# Patient Record
Sex: Male | Born: 1985
Health system: Southern US, Community
[De-identification: ages and names within clinical notes are randomized; demographics above are authoritative.]

## PROBLEM LIST (undated history)

## (undated) DIAGNOSIS — F909 Attention-deficit hyperactivity disorder, unspecified type: Secondary | ICD-10-CM

## (undated) HISTORY — DX: Attention-deficit hyperactivity disorder, unspecified type: F90.9

---

## 2010-08-16 DIAGNOSIS — F419 Anxiety disorder, unspecified: Secondary | ICD-10-CM | POA: Insufficient documentation

## 2012-04-02 ENCOUNTER — Encounter: Payer: Self-pay | Admitting: Family Medicine

## 2012-04-02 ENCOUNTER — Ambulatory Visit (INDEPENDENT_AMBULATORY_CARE_PROVIDER_SITE_OTHER): Payer: No Typology Code available for payment source | Admitting: Family Medicine

## 2012-04-02 VITALS — BP 126/74 | HR 86 | Temp 98.6°F | Resp 16 | Ht 69.0 in | Wt 184.0 lb

## 2012-04-02 DIAGNOSIS — F909 Attention-deficit hyperactivity disorder, unspecified type: Secondary | ICD-10-CM

## 2012-04-02 MED ORDER — LISDEXAMFETAMINE DIMESYLATE 30 MG PO CAPS: 30.0000 mg | ORAL_CAPSULE | ORAL | Status: DC

## 2012-04-02 NOTE — Patient Instructions (Signed)
Attention Deficit Hyperactivity Disorder Attention deficit hyperactivity disorder (ADHD) is a problem with behavior issues based on the way the brain functions (neurobehavioral disorder). It is a common reason for behavior and academic problems in school. CAUSES  The cause of ADHD is unknown in most cases. It may run in families. It sometimes can be associated with learning disabilities and other behavioral problems. SYMPTOMS  There are 3 types of ADHD. The 3 types and some of the symptoms include:  Inattentive  Gets bored or distracted easily.  Loses or forgets things. Forgets to hand in homework.  Has trouble organizing or completing tasks.  Difficulty staying on task.  An inability to organize daily tasks and school work.  Leaving projects, chores, or homework unfinished.  Trouble paying attention or responding to details. Careless mistakes.  Difficulty following directions. Often seems like is not listening.  Dislikes activities that require sustained attention (like chores or homework).  Hyperactive-impulsive  Feels like it is impossible to sit still or stay in a seat. Fidgeting with hands and feet.  Trouble waiting turn.  Talking too much or out of turn. Interruptive.  Speaks or acts impulsively.  Aggressive, disruptive behavior.  Constantly busy or on the go, noisy.  Combined  Has symptoms of both of the above. Often children with ADHD feel discouraged about themselves and with school. They often perform well below their abilities in school. These symptoms can cause problems in home, school, and in relationships with peers. As children get older, the excess motor activities can calm down, but the problems with paying attention and staying organized persist. Most children do not outgrow ADHD but with good treatment can learn to cope with the symptoms. DIAGNOSIS  When ADHD is suspected, the diagnosis should be made by professionals trained in ADHD.  Diagnosis will  include:  Ruling out other reasons for the child's behavior.  The caregivers will check with the child's school and check their medical records.  They will talk to teachers and parents.  Behavior rating scales for the child will be filled out by those dealing with the child on a daily basis. A diagnosis is made only after all information has been considered. TREATMENT  Treatment usually includes behavioral treatment often along with medicines. It may include stimulant medicines. The stimulant medicines decrease impulsivity and hyperactivity and increase attention. Other medicines used include antidepressants and certain blood pressure medicines. Most experts agree that treatment for ADHD should address all aspects of the child's functioning. Treatment should not be limited to the use of medicines alone. Treatment should include structured classroom management. The parents must receive education to address rewarding good behavior, discipline, and limit-setting. Tutoring or behavioral therapy or both should be available for the child. If untreated, the disorder can have long-term serious effects into adolescence and adulthood. HOME CARE INSTRUCTIONS   Often with ADHD there is a lot of frustration among the family in dealing with the illness. There is often blame and anger that is not warranted. This is a life long illness. There is no way to prevent ADHD. In many cases, because the problem affects the family as a whole, the entire family may need help. A therapist can help the family find better ways to handle the disruptive behaviors and promote change. If the child is young, most of the therapist's work is with the parents. Parents will learn techniques for coping with and improving their child's behavior. Sometimes only the child with the ADHD needs counseling. Your caregivers can help   you make these decisions.  Children with ADHD may need help in organizing. Some helpful tips include:  Keep  routines the same every day from wake-up time to bedtime. Schedule everything. This includes homework and playtime. This should include outdoor and indoor recreation. Keep the schedule on the refrigerator or a bulletin board where it is frequently seen. Mark schedule changes as far in advance as possible.  Have a place for everything and keep everything in its place. This includes clothing, backpacks, and school supplies.  Encourage writing down assignments and bringing home needed books.  Offer your child a well-balanced diet. Breakfast is especially important for school performance. Children should avoid drinks with caffeine including:  Soft drinks.  Coffee.  Tea.  However, some older children (adolescents) may find these drinks helpful in improving their attention.  Children with ADHD need consistent rules that they can understand and follow. If rules are followed, give small rewards. Children with ADHD often receive, and expect, criticism. Look for good behavior and praise it. Set realistic goals. Give clear instructions. Look for activities that can foster success and self-esteem. Make time for pleasant activities with your child. Give lots of affection.  Parents are their children's greatest advocates. Learn as much as possible about ADHD. This helps you become a stronger and better advocate for your child. It also helps you educate your child's teachers and instructors if they feel inadequate in these areas. Parent support groups are often helpful. A national group with local chapters is called CHADD (Children and Adults with Attention Deficit Hyperactivity Disorder). PROGNOSIS  There is no cure for ADHD. Children with the disorder seldom outgrow it. Many find adaptive ways to accommodate the ADHD as they mature. SEEK MEDICAL CARE IF:  Your child has repeated muscle twitches, cough or speech outbursts.  Your child has sleep problems.  Your child has a marked loss of  appetite.  Your child develops depression.  Your child has new or worsening behavioral problems.  Your child develops dizziness.  Your child has a racing heart.  Your child has stomach pains.  Your child develops headaches. Document Released: 01/17/2002 Document Revised: 04/21/2011 Document Reviewed: 08/30/2007 Exit[employer] Patient Information 2013 [employer].  

## 2012-04-02 NOTE — Progress Notes (Signed)
Subjective:    Patient ID: Nathaniel Soto, male    DOB: 1985-04-26, 27 y.o.   MRN: [id number] Chief Complaint  Patient presents with  . Advice Only    ADD issues    HPI  Started on adderrall from Jan to July 2012 during his senior yr of college. Diagnosed by Dr. Tish Frederickson - recommended by [facility] mental health dept. - it was in [place] - he things she might have been in her own practice.  One of the leading ADD people in that area.  Was then started on 20mg  of ER adderrall which helped immensely.  After he graduated, he continued to have a prescription for it, which he brought in today, but he couldn't get it filled because he was out of insurance and it was very expensive.  Has never been on anything else other than adderrall but was interested in trying something longer acting.  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  No past medical history on file. No current outpatient prescriptions on file prior to visit.   No current facility-administered medications on file prior to visit.   No Known Allergies   Review of Systems  Constitutional: Negative for fever, chills, diaphoresis, activity change, appetite change, fatigue and unexpected weight change.  Respiratory: Negative for chest tightness and shortness of breath.   Cardiovascular: Negative for chest pain and palpitations.  Gastrointestinal: Negative for nausea and vomiting.  Neurological: Negative for dizziness, tremors, syncope and light-headedness.  Psychiatric/Behavioral: Positive for decreased concentration. Negative for suicidal ideas, hallucinations, confusion, sleep disturbance, self-injury, dysphoric mood and agitation. The patient is nervous/anxious. The patient is not hyperactive.       BP 126/74  Pulse 86  Temp(Src) 98.6 F (37 C)  Resp 16  Ht 5\' 9"  (1.753 m)  Wt 184 lb (83.462 kg)  BMI 27.16 kg/m2 Objective:   Physical Exam  Constitutional: He is oriented to person, place,  and time. He appears well-developed and well-nourished. No distress.  HENT:  Head: Normocephalic and atraumatic.  Eyes: Conjunctivae are normal. Pupils are equal, round, and reactive to light. No scleral icterus.  Neck: Normal range of motion. Neck supple. No thyromegaly present.  Cardiovascular: Normal rate, regular rhythm, normal heart sounds and intact distal pulses.   Pulmonary/Chest: Effort normal and breath sounds normal. No respiratory distress.  Musculoskeletal: He exhibits no edema.  Lymphadenopathy:    He has no cervical adenopathy.  Neurological: He is alert and oriented to person, place, and time.  Skin: Skin is warm and dry. He is not diaphoretic.  Psychiatric: He has a normal mood and affect. His behavior is normal.      Assessment & Plan:  Attention deficit disorder with hyperactivity - Plan: lisdexamfetamine (VYVANSE) 30 MG capsule, DISCONTINUED: lisdexamfetamine (VYVANSE) 30 MG capsule, DISCONTINUED: lisdexamfetamine (VYVANSE) 30 MG capsule  Meds ordered this encounter  Medications  . DISCONTD: lisdexamfetamine (VYVANSE) 30 MG capsule    Sig: Take 1 capsule (30 mg total) by mouth every morning.    Dispense:  30 capsule    Refill:  0  . DISCONTD: lisdexamfetamine (VYVANSE) 30 MG capsule    Sig: Take 1 capsule (30 mg total) by mouth every morning. May fill on or after 04/30/12.    Dispense:  30 capsule    Refill:  0  . lisdexamfetamine (VYVANSE) 30 MG capsule    Sig: Take 1 capsule (30 mg total) by mouth every morning. May fill on or  after 05/31/12.    Dispense:  30 capsule    Refill:  0

## 2012-07-12 ENCOUNTER — Ambulatory Visit (INDEPENDENT_AMBULATORY_CARE_PROVIDER_SITE_OTHER): Payer: 59 | Admitting: Family Medicine

## 2012-07-12 VITALS — BP 110/66 | HR 67 | Temp 98.3°F | Resp 16 | Ht 68.0 in | Wt 183.8 lb

## 2012-07-12 DIAGNOSIS — F909 Attention-deficit hyperactivity disorder, unspecified type: Secondary | ICD-10-CM

## 2012-07-12 MED ORDER — LISDEXAMFETAMINE DIMESYLATE 30 MG PO CAPS: 30.0000 mg | ORAL_CAPSULE | ORAL | Status: DC

## 2012-07-12 NOTE — Progress Notes (Signed)
Subjective:    Patient ID: Nathaniel Soto, male    DOB: [birth year], 27 y.o.   MRN: [id number] Chief Complaint  Patient presents with  . Medication Refill    HPI  vyvanse has helped - better dry mouth, less side effects than adderrall.  Not completely perfect - still sometimes a little dry mouth but not near as much as the adderrall - generally mouth wash or gum with xyletol helps a lot.Only tends to bother him 1-2x/wk while it was several times daily on the adderrall.  If he forgets to take the vyvance, he can't take it after noon so sometimes misses dose and has unfocused days. Otherwise doing great. 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 Has also been on strattera and Wellbutrin in the distant past but didn't help as much and hated the way it made him feels.  Has never tried ritalin or concerta. Sleeping well. Eating well.  History reviewed. No pertinent past medical history. No current outpatient prescriptions on file prior to visit.   No current facility-administered medications on file prior to visit.   No Known Allergies  Review of Systems  Constitutional: Negative for diaphoresis, activity change, appetite change and unexpected weight change.  HENT: Positive for trouble swallowing. Negative for drooling.        Dry mouth  Respiratory: Negative for chest tightness and shortness of breath.   Cardiovascular: Negative for chest pain and palpitations.  Gastrointestinal: Negative for nausea and vomiting.  Neurological: Negative for dizziness, tremors, syncope and light-headedness.  Psychiatric/Behavioral: Positive for decreased concentration. Negative for suicidal ideas, hallucinations, confusion, sleep disturbance, self-injury, dysphoric mood and agitation. The patient is not nervous/anxious and is not hyperactive.       BP 110/66  Pulse 67  Temp(Src) 98.3 F (36.8 C) (Oral)  Resp 16  Ht 5\' 8"  (1.727 m)  Wt 183 lb 12.8 oz (83.371 kg)  BMI 27.95 kg/m2   SpO2 99% Objective:   Physical Exam  Constitutional: He is oriented to person, place, and time. He appears well-developed and well-nourished. No distress.  HENT:  Head: Normocephalic and atraumatic.  Eyes: Conjunctivae are normal. Pupils are equal, round, and reactive to light. No scleral icterus.  Neck: Normal range of motion. Neck supple. No thyromegaly present.  Cardiovascular: Normal rate, regular rhythm, normal heart sounds and intact distal pulses.   Pulmonary/Chest: Effort normal and breath sounds normal. No respiratory distress.  Musculoskeletal: He exhibits no edema.  Lymphadenopathy:    He has no cervical adenopathy.  Neurological: He is alert and oriented to person, place, and time.  Skin: Skin is warm and dry. He is not diaphoretic.  Psychiatric: He has a normal mood and affect. His behavior is normal.      Assessment & Plan:  ADHD - side effects manageable so wants to hold off on trying ritalin or concerta for now but can in the future if he develops more sxs vyvanse.  Consider doing routine labs at f/u but declines today - needle phobia. 3 rxs given.  No further refills till next visit around 10/12/12 Meds ordered this encounter  Medications  . DISCONTD: lisdexamfetamine (VYVANSE) 30 MG capsule    Sig: Take 1 capsule (30 mg total) by mouth every morning.    Dispense:  30 capsule    Refill:  0  . DISCONTD: lisdexamfetamine (VYVANSE) 30 MG capsule    Sig: Take 1 capsule (30 mg total) by mouth every morning. May fill on or after 08/11/12  Dispense:  30 capsule    Refill:  0  . lisdexamfetamine (VYVANSE) 30 MG capsule    Sig: Take 1 capsule (30 mg total) by mouth every morning. May fill on or after 09/11/12    Dispense:  30 capsule    Refill:  0

## 2012-11-12 ENCOUNTER — Ambulatory Visit (INDEPENDENT_AMBULATORY_CARE_PROVIDER_SITE_OTHER): Payer: 59 | Admitting: Physician Assistant

## 2012-11-12 VITALS — BP 112/66 | HR 66 | Temp 98.3°F | Resp 18 | Ht 69.5 in | Wt 201.0 lb

## 2012-11-12 DIAGNOSIS — R0981 Nasal congestion: Secondary | ICD-10-CM

## 2012-11-12 DIAGNOSIS — B354 Tinea corporis: Secondary | ICD-10-CM

## 2012-11-12 DIAGNOSIS — R05 Cough: Secondary | ICD-10-CM

## 2012-11-12 DIAGNOSIS — J329 Chronic sinusitis, unspecified: Secondary | ICD-10-CM

## 2012-11-12 DIAGNOSIS — J3489 Other specified disorders of nose and nasal sinuses: Secondary | ICD-10-CM

## 2012-11-12 DIAGNOSIS — R059 Cough, unspecified: Secondary | ICD-10-CM

## 2012-11-12 MED ORDER — KETOCONAZOLE 2 % EX CREA: TOPICAL_CREAM | Freq: Every day | CUTANEOUS | Status: DC

## 2012-11-12 MED ORDER — AMOXICILLIN 875 MG PO TABS: 875.0000 mg | ORAL_TABLET | Freq: Two times a day (BID) | ORAL | Status: DC

## 2012-11-12 MED ORDER — BENZONATATE 100 MG PO CAPS: 100.0000 mg | ORAL_CAPSULE | Freq: Three times a day (TID) | ORAL | Status: DC | PRN

## 2012-11-12 MED ORDER — FLUCONAZOLE 150 MG PO TABS: 150.0000 mg | ORAL_TABLET | Freq: Once | ORAL | Status: DC

## 2012-11-12 MED ORDER — IPRATROPIUM BROMIDE 0.03 % NA SOLN: 2.0000 | Freq: Two times a day (BID) | NASAL | Status: DC

## 2012-11-12 NOTE — Progress Notes (Signed)
  Subjective:    Patient ID: Nathaniel Soto, male    DOB: 25-Oct-1985, 27 y.o.   MRN: [id number]  Rash Associated symptoms include congestion, coughing, rhinorrhea and a sore throat. Pertinent negatives include no fever, shortness of breath or vomiting.   27 year old pleasant male presents today with 2 concerns: #1) URI sx's x 1 week. Complains of nasal congestion, PND, dry, hacking cough, and chills. No documented fever, headache, sinus pain, SOB, wheezing, chest pain, or otalgia.  Has not taken any OTC medications for this. No hx of allergies.   #2) Rash bilateral forearms x 4 weeks.  Got new kitten in July that developed ringworm about 1 months after he got her.  Has been using anti-fungal spray for her which has helped significantly.  Has used OTC Lotrimin cream on his arms that does not seem to be helping at all. Has had several new spots erupt and so he decided to come in for evaluation.  Describes the area as pruritic and spreading.  All spots have enlarged since onset.  Admits he looked at the lesions under a black light and there were green speckles in them.   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   Review of Systems  Constitutional: Positive for chills. Negative for fever.  HENT: Positive for congestion, sore throat, rhinorrhea, postnasal drip and sinus pressure. Negative for ear pain and ear discharge.   Respiratory: Positive for cough. Negative for chest tightness, shortness of breath and wheezing.   Gastrointestinal: Negative for nausea and vomiting.  Skin: Positive for rash.  Neurological: Negative for dizziness and headaches.       Objective:   Physical Exam  Constitutional: He is oriented to person, place, and time. He appears well-developed and well-nourished.  HENT:  Head: Normocephalic and atraumatic.  Right Ear: Hearing, tympanic membrane, external ear and ear canal normal.  Left Ear: Hearing, tympanic membrane, external ear and ear canal normal.  Mouth/Throat: Uvula is  midline, oropharynx is clear and moist and mucous membranes are normal. No oropharyngeal exudate.  Eyes: Conjunctivae are normal.  Neck: Normal range of motion. Neck supple.  Cardiovascular: Normal rate, regular rhythm and normal heart sounds.   Pulmonary/Chest: Effort normal and breath sounds normal.  Lymphadenopathy:    He has no cervical adenopathy.  Neurological: He is alert and oriented to person, place, and time.  Skin:     Noted areas have round, erythematous, annular lesions with raised, scaling borders.    Psychiatric: He has a normal mood and affect. His behavior is normal. Judgment and thought content normal.          Assessment & Plan:  Tinea corporis - Plan: fluconazole (DIFLUCAN) 150 MG tablet, ketoconazole (NIZORAL) 2 % cream  - Ketoconazole 2% cream daily   - Diflucan 150 mg x 2 doses Nasal congestion - Plan: ipratropium (ATROVENT) 0.03 % nasal spray Sinusitis - Plan: amoxicillin (AMOXIL) 875 MG tablet Cough - Plan: benzonatate (TESSALON) 100 MG capsule  - Atrovent NS twice daily to help with congestion and PND  - Tessalon perles tid prn cough  - Increase fluids and rest  - Amoxicillin 875 mg bid x 10 days Follow up if symptoms worsen or fail to improve.

## 2012-12-22 ENCOUNTER — Ambulatory Visit (INDEPENDENT_AMBULATORY_CARE_PROVIDER_SITE_OTHER): Payer: 59 | Admitting: Family Medicine

## 2012-12-22 VITALS — BP 108/58 | HR 64 | Temp 98.5°F | Resp 16 | Ht 68.5 in | Wt 203.4 lb

## 2012-12-22 DIAGNOSIS — F909 Attention-deficit hyperactivity disorder, unspecified type: Secondary | ICD-10-CM

## 2012-12-22 MED ORDER — LISDEXAMFETAMINE DIMESYLATE 20 MG PO CAPS: 20.0000 mg | ORAL_CAPSULE | ORAL | Status: DC

## 2012-12-22 NOTE — Progress Notes (Signed)
Subjective:    Patient ID: Nathaniel Soto, male    DOB: 1985/04/22, 27 y.o.   MRN: [id number] This chart was scribed for Norberto Sorenson, MD by Clydene Laming, ED Scribe. This patient was seen in room Room 14 and the patient's care was started at 8:11 PM. HPI Chief Complaint  Patient presents with  . Medication Refill    Vyvanse refill, pt request to have the dosage lowered     HPI Comments: Nathaniel Soto is a 27 y.o. male who presents to [facility] requesting a medication refill. He wants to go down on Vyvanse dosage. Has been on 30mg  of Vyvanse for over 6 mos. He states if he forgets to take in the morning, he absolutely cannot take after noon as then he would not be able to sleep at all that night. He states that he has noticed that if he does not sleep well and then takes vyvanse the next day, by the end of the day, his "mood goes to complete crap". He states the upswing is neurotic and the downswing is very negative. He states the dry mouth side effect is fine and minimal. 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  In the past he has tried meds to help sleep. He states melatonin "crushes" him in the morning. He does want to take xanax due to his mothers use and side effects. He has been using low dose trazodone prn - still has a few left from prior PCP. His mother gave him benadryl to sleep when he was young so doesn't really like to take this anymore.  Pt was last seen 5 mts ago, given 3 refills at that time, so clearly not using stimulant therapy too frequently - about 1/2 the time. Failed adderall and stratterra and wellbutrin.  Past Medical History  Diagnosis Date  . ADHD (attention deficit hyperactivity disorder)   .  No Known Allergies Current Outpatient Prescriptions on File Prior to Visit  Medication Sig Dispense Refill  . amoxicillin (AMOXIL) 875 MG tablet Take 1 tablet (875 mg total) by mouth 2 (two) times daily.  20 tablet  0  . benzonatate (TESSALON) 100 MG  capsule Take 1-2 capsules (100-200 mg total) by mouth 3 (three) times daily as needed for cough.  40 capsule  0  . fluconazole (DIFLUCAN) 150 MG tablet Take 1 tablet (150 mg total) by mouth once. Repeat if needed  2 tablet  0  . ipratropium (ATROVENT) 0.03 % nasal spray Place 2 sprays into the nose 2 (two) times daily.  30 mL  5  . ketoconazole (NIZORAL) 2 % cream Apply topically daily.  15 g  0   No current facility-administered medications on file prior to visit.    Review of Systems  Constitutional: Negative for diaphoresis, activity change, appetite change, fatigue and unexpected weight change.  Respiratory: Negative for chest tightness and shortness of breath.   Cardiovascular: Negative for chest pain and palpitations.  Gastrointestinal: Negative for nausea and vomiting.  Neurological: Negative for dizziness, tremors, syncope and light-headedness.  Psychiatric/Behavioral: Positive for sleep disturbance and decreased concentration. Negative for suicidal ideas, hallucinations, behavioral problems, confusion, self-injury, dysphoric mood and agitation. The patient is not nervous/anxious and is not hyperactive.       BP 108/58  Pulse 64  Temp(Src) 98.5 F (36.9 C) (Oral)  Resp 16  Ht 5' 8.5" (1.74 m)  Wt 203 lb 6.4 oz (92.262 kg)  BMI 30.47 kg/m2  SpO2 99% Objective:  Physical Exam  Nursing note and vitals reviewed. Constitutional: He is oriented to person, place, and time. He appears well-developed and well-nourished. No distress.  HENT:  Head: Normocephalic and atraumatic.  Eyes: Conjunctivae and EOM are normal. Pupils are equal, round, and reactive to light. No scleral icterus.  Neck: Normal range of motion. Neck supple. No tracheal deviation present. No thyromegaly present.  Cardiovascular: Normal rate, regular rhythm, normal heart sounds and intact distal pulses.   Pulmonary/Chest: Effort normal and breath sounds normal. No respiratory distress.  Musculoskeletal: Normal  range of motion. He exhibits no edema.  Lymphadenopathy:    He has no cervical adenopathy.  Neurological: He is alert and oriented to person, place, and time.  Skin: Skin is warm and dry. He is not diaphoretic.  Psychiatric: He has a normal mood and affect. His behavior is normal.      Assessment & Plan:   Attention deficit disorder with hyperactivity(314.01) - Plan: lisdexamfetamine (VYVANSE) 20 MG capsule Try to decrease vyvanse from 30 to 20mg  qd. Pt will let me know how he does on this and then I will give him 2 additional rxs. Discussed ok to cont augmenting w/ prn trazodone 25mg  for sleep. If he is not having enough focus, could consider augmenting w/ very low dose of ritalin such as 5mg  short-acting. Meds ordered this encounter  Medications  . lisdexamfetamine (VYVANSE) 20 MG capsule    Sig: Take 1 capsule (20 mg total) by mouth every morning. May fill on or after 09/11/12    Dispense:  30 capsule    Refill:  0    I personally performed the services described in this documentation, which was scribed in my presence. The recorded information has been reviewed and considered, and addended by me as needed.  Norberto Sorenson, MD MPH

## 2013-01-31 ENCOUNTER — Ambulatory Visit (INDEPENDENT_AMBULATORY_CARE_PROVIDER_SITE_OTHER): Payer: 59 | Admitting: Family Medicine

## 2013-01-31 DIAGNOSIS — F909 Attention-deficit hyperactivity disorder, unspecified type: Secondary | ICD-10-CM

## 2013-01-31 MED ORDER — LISDEXAMFETAMINE DIMESYLATE 20 MG PO CAPS: 20.0000 mg | ORAL_CAPSULE | Freq: Every day | ORAL | Status: DC

## 2013-01-31 MED ORDER — LISDEXAMFETAMINE DIMESYLATE 20 MG PO CAPS: 20.0000 mg | ORAL_CAPSULE | ORAL | Status: DC

## 2013-01-31 NOTE — Progress Notes (Signed)
Pt presented to clinic for med refill. He does not actually require a visit - he has bene doing well and wants to continue on the same dose of medication which is lower than prior.  See last OV note 1 mo prev.  Will provide pt refills on his vyvanse to last him until after my maternity leave. He can plan to f/u in May. Pt NOT seen in an OV today.

## 2013-05-30 ENCOUNTER — Ambulatory Visit (INDEPENDENT_AMBULATORY_CARE_PROVIDER_SITE_OTHER): Payer: 59 | Admitting: Family Medicine

## 2013-05-30 VITALS — BP 102/70 | HR 64 | Temp 98.1°F | Resp 16 | Ht 68.5 in | Wt 213.0 lb

## 2013-05-30 DIAGNOSIS — J069 Acute upper respiratory infection, unspecified: Secondary | ICD-10-CM

## 2013-05-30 DIAGNOSIS — B079 Viral wart, unspecified: Secondary | ICD-10-CM

## 2013-05-30 NOTE — Progress Notes (Signed)
[facility] [address], [place] [address] [phone]  Date:  05/30/2013   Name:  Nathaniel Soto   DOB:  Mar 07, 1985   MRN:  [id number]  PCP:  Delman Cheadle, MD    Chief Complaint: wart removal and Nasal Congestion   History of Present Illness:  Nathaniel Soto is a 28 y.o. very pleasant male patient who presents with the following:  Here today for a couple of concerns.  Noted that his BP was 108/58 in 12/2012.  He has noted a wart on his right long finger for about 2 months.  It is not painful but is annoying.  He also has a small wart on the left index finger  He also notes congestion in his ears and nose for a few days; may be 4 or 5 days.  He notes a crackling feeling in his left ear, but they do not hurt.   No sneezing, nasal congestion Some cough. Some ST.   He does have some atrovent nasal at home and tried it over the last few days.  He has felt a little tired and achy but recently started an exercise program,  He has not had any fever or chills    He is good on his Vyvanse. He does not use it every day so it lasts him longer than a month.    He is generally very healthy.   His BP tends to run low and he just arose this am   Patient Active Problem List   Diagnosis Date Noted  . Attention deficit disorder with hyperactivity 04/02/2012    Past Medical History  Diagnosis Date  . ADHD (attention deficit hyperactivity disorder)     No past surgical history on file.  History  Substance Use Topics  . Smoking status: Former Research scientist (life sciences)  . Smokeless tobacco: Not on file  . Alcohol Use: 1.2 oz/week    2 Cans of beer per week    Family History  Problem Relation Age of Onset  . Diabetes Paternal Grandmother   . Alcoholism Mother     No Known Allergies  Medication list has been reviewed and updated.  Current Outpatient Prescriptions on File Prior to Visit  Medication Sig Dispense Refill  . ipratropium (ATROVENT) 0.03 % nasal spray Place 2  sprays into the nose 2 (two) times daily.  30 mL  5  . lisdexamfetamine (VYVANSE) 20 MG capsule Take 1 capsule (20 mg total) by mouth every morning. May fill on or after 05/01/2013.  30 capsule  0   No current facility-administered medications on file prior to visit.    Review of Systems:  As per HPI- otherwise negative.   Physical Examination: Filed Vitals:   05/30/13 1059  BP: 102/51  Pulse: 64  Temp: 98.1 F (36.7 C)  Resp: 16   Filed Vitals:   05/30/13 1059  Height: 5' 8.5" (1.74 m)  Weight: 213 lb (96.616 kg)   Body mass index is 31.91 kg/(m^2). Ideal Body Weight: Weight in (lb) to have BMI = 25: 166.5  GEN: WDWN, NAD, Non-toxic, A & O x 3, looks well HEENT: Atraumatic, Normocephalic. Neck supple. No masses, No LAD.  Bilateral TM wnl, oropharynx normal.  PEERL,EOMI.   Nasal cavity is congested Ears and Nose: No external deformity. CV: RRR, No M/G/R. No JVD. No thrill. No extra heart sounds. PULM: CTA B, no wheezes, crackles, rhonchi. No retractions. No resp. distress. No accessory muscle use. EXTR: No c/c/e NEURO Normal gait.  PSYCH: Normally interactive. Conversant. Not depressed or anxious appearing.  Calm demeanor.  There is a small wart on the pad of the right long finger and on the distal knuckle of the left index finger.  Both treated with LN x3 rounds each   Assessment and Plan: Viral URI  Viral warts  Viral warts treated as above. Instructions regarding viral URI as per pt instructions.    Signed Lamar Blinks, MD

## 2013-05-30 NOTE — Patient Instructions (Signed)
We froze your two warts today.  If they do not go away over the next 2 or 3 weeks we can freeze them again, or you can try an OTC treatment such as salicylic acid wart remover.    For your cold, please use your atrovent spray as needed for drainage and runny nose.  Afrin nasal spray (OTC) will be helpful for nasal congestion.  However remember to stop using this after 4 or 5 days or nasal swelling can actually get worse.  mucinex D will also help with your head congestion and ear congestion.    Let me know if you do not feel better in the next few days- Sooner if worse.

## 2013-12-16 ENCOUNTER — Ambulatory Visit (INDEPENDENT_AMBULATORY_CARE_PROVIDER_SITE_OTHER): Payer: 59 | Admitting: Family Medicine

## 2013-12-16 VITALS — BP 122/70 | HR 65 | Temp 98.6°F | Resp 16 | Ht 69.0 in | Wt 209.6 lb

## 2013-12-16 DIAGNOSIS — F909 Attention-deficit hyperactivity disorder, unspecified type: Secondary | ICD-10-CM

## 2013-12-16 DIAGNOSIS — Z113 Encounter for screening for infections with a predominantly sexual mode of transmission: Secondary | ICD-10-CM

## 2013-12-16 DIAGNOSIS — Z131 Encounter for screening for diabetes mellitus: Secondary | ICD-10-CM

## 2013-12-16 DIAGNOSIS — Z0000 Encounter for general adult medical examination without abnormal findings: Secondary | ICD-10-CM

## 2013-12-16 DIAGNOSIS — Z13220 Encounter for screening for lipoid disorders: Secondary | ICD-10-CM

## 2013-12-16 DIAGNOSIS — F988 Other specified behavioral and emotional disorders with onset usually occurring in childhood and adolescence: Secondary | ICD-10-CM

## 2013-12-16 MED ORDER — LISDEXAMFETAMINE DIMESYLATE 20 MG PO CAPS: 20.0000 mg | ORAL_CAPSULE | ORAL | Status: DC

## 2013-12-16 NOTE — Progress Notes (Signed)
[facility] [address] [place] [address] [phone]  Date:  12/16/2013   Name:  Nathaniel Soto   DOB:  04/16/1985   MRN:  [id number]  PCP:  Delman Cheadle, MD    Chief Complaint: Annual Exam   History of Present Illness:  Nathaniel Soto is a 28 y.o. very pleasant male patient who presents with the following:  He is here today for a CPE.  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 He last ate around 1pm.   He needs labs for his form, and would like to do STI testing at the same time as he hates blood draws He is generally healthy.  He would like to exercise more.   He does not smoke He uses vyvanse really just on occasion and would like a refill today if possible   Patient Active Problem List   Diagnosis Date Noted  . Attention deficit disorder with hyperactivity 04/02/2012    Past Medical History  Diagnosis Date  . ADHD (attention deficit hyperactivity disorder)     History reviewed. No pertinent past surgical history.  History  Substance Use Topics  . Smoking status: Former Research scientist (life sciences)  . Smokeless tobacco: Not on file  . Alcohol Use: 1.2 oz/week    2 Cans of beer per week    Family History  Problem Relation Age of Onset  . Diabetes Paternal Grandmother   . Alcoholism Mother     No Known Allergies  Medication list has been reviewed and updated.  Current Outpatient Prescriptions on File Prior to Visit  Medication Sig Dispense Refill  . lisdexamfetamine (VYVANSE) 20 MG capsule Take 1 capsule (20 mg total) by mouth every morning. May fill on or after 05/01/2013. 30 capsule 0  . ipratropium (ATROVENT) 0.03 % nasal spray Place 2 sprays into the nose 2 (two) times daily. 30 mL 5   No current facility-administered medications on file prior to visit.    Review of Systems:  As per HPI- otherwise negative.   Physical Examination: Filed Vitals:   12/16/13 1808  BP: 122/70  Pulse: 65  Temp: 98.6 F (37 C)  Resp: 16   Filed Vitals:   12/16/13 1808  Height: 5\' 9"  (1.753 m)  Weight: 209 lb 9.6 oz (95.074 kg)   Body mass index is 30.94 kg/(m^2). Ideal Body Weight: Weight in (lb) to have BMI = 25: 168.9  GEN: WDWN, NAD, Non-toxic, A & O x 3, looks well, overweight HEENT: Atraumatic, Normocephalic. Neck supple. No masses, No LAD.  Bilateral TM wnl, oropharynx normal.  PEERL,EOMI.   Ears and Nose: No external deformity. CV: RRR, No M/G/R. No JVD. No thrill. No extra heart sounds. PULM: CTA B, no wheezes, crackles, rhonchi. No retractions. No resp. distress. No accessory muscle use. ABD: S, NT, ND, +BS. No rebound. No HSM. EXTR: No c/c/e NEURO Normal gait.  PSYCH: Normally interactive. Conversant. Not depressed or anxious appearing.  Calm demeanor.  GU: normal exam of testicles, scrotum and penis  Assessment and Plan: Physical exam - Plan: CBC  Screening for hyperlipidemia - Plan: Lipid panel  Screening for diabetes mellitus - Plan: Comprehensive metabolic panel  Screening for STD (sexually transmitted disease) - Plan: Hepatitis B surface antibody, Hepatitis C antibody, Hepatitis B surface antigen, GC/Chlamydia Probe Amp, RPR, HIV antibody  ADD (attention deficit disorder) - Plan: lisdexamfetamine (VYVANSE) 20 MG capsule  Refilled his vyvanse Labs pending as above Compete form when labs in encouraged him to get more exercise  Signed Lamar Blinks, MD

## 2013-12-16 NOTE — Patient Instructions (Signed)
Good to see you today.  I will be in touch with your labs asap.   I will also fax in your form for you.   Take care and think about getting the flu mist for flu protection It would be a good idea to work on fitness for your long- term health

## 2013-12-17 LAB — COMPREHENSIVE METABOLIC PANEL
ALBUMIN: 4.6 g/dL (ref 3.5–5.2)
ALK PHOS: 99 U/L (ref 39–117)
ALT: 42 U/L (ref 0–53)
AST: 21 U/L (ref 0–37)
BUN: 15 mg/dL (ref 6–23)
CALCIUM: 10 mg/dL (ref 8.4–10.5)
CHLORIDE: 102 meq/L (ref 96–112)
CO2: 26 mEq/L (ref 19–32)
Creat: 0.9 mg/dL (ref 0.50–1.35)
Glucose, Bld: 91 mg/dL (ref 70–99)
POTASSIUM: 4.1 meq/L (ref 3.5–5.3)
SODIUM: 139 meq/L (ref 135–145)
TOTAL PROTEIN: 8.3 g/dL (ref 6.0–8.3)
Total Bilirubin: 0.4 mg/dL (ref 0.2–1.2)

## 2013-12-17 LAB — HEPATITIS B SURFACE ANTIGEN: HEP B S AG: NEGATIVE

## 2013-12-17 LAB — LIPID PANEL
Cholesterol: 168 mg/dL (ref 0–200)
HDL: 35 mg/dL — ABNORMAL LOW (ref >39)
LDL Cholesterol: 109 mg/dL — ABNORMAL HIGH (ref 0–99)
Total CHOL/HDL Ratio: 4.8 Ratio
Triglycerides: 121 mg/dL (ref <150)
VLDL: 24 mg/dL (ref 0–40)

## 2013-12-17 LAB — CBC
HCT: 41.8 % (ref 39.0–52.0)
Hemoglobin: 14.2 g/dL (ref 13.0–17.0)
MCH: 26.9 pg (ref 26.0–34.0)
MCHC: 34 g/dL (ref 30.0–36.0)
MCV: 79.2 fL (ref 78.0–100.0)
PLATELETS: 313 10*3/uL (ref 150–400)
RBC: 5.28 MIL/uL (ref 4.22–5.81)
RDW: 14.6 % (ref 11.5–15.5)
WBC: 11.3 10*3/uL — ABNORMAL HIGH (ref 4.0–10.5)

## 2013-12-17 LAB — RPR

## 2013-12-17 LAB — HEPATITIS B SURFACE ANTIBODY, QUANTITATIVE: HEPATITIS B-POST: 128 m[IU]/mL

## 2013-12-17 LAB — HEPATITIS C ANTIBODY: HCV AB: NEGATIVE

## 2013-12-17 LAB — HIV ANTIBODY (ROUTINE TESTING W REFLEX): HIV: NONREACTIVE

## 2013-12-19 ENCOUNTER — Telehealth: Payer: Self-pay | Admitting: *Deleted

## 2013-12-19 ENCOUNTER — Encounter: Payer: Self-pay | Admitting: Family Medicine

## 2013-12-19 ENCOUNTER — Other Ambulatory Visit: Payer: Self-pay | Admitting: Family Medicine

## 2013-12-19 DIAGNOSIS — D72829 Elevated white blood cell count, unspecified: Secondary | ICD-10-CM

## 2013-12-19 LAB — GC/CHLAMYDIA PROBE AMP
CT PROBE, AMP APTIMA: NEGATIVE
GC Probe RNA: NEGATIVE

## 2013-12-19 NOTE — Telephone Encounter (Signed)
Faxed completed form, per Dr Lorelei Pont. Confirmation page received 10:08 am.

## 2014-06-30 ENCOUNTER — Ambulatory Visit (INDEPENDENT_AMBULATORY_CARE_PROVIDER_SITE_OTHER): Payer: 59 | Admitting: Family Medicine

## 2014-06-30 VITALS — BP 101/71 | HR 67 | Temp 97.7°F | Resp 16 | Ht 68.5 in | Wt 215.6 lb

## 2014-06-30 DIAGNOSIS — J209 Acute bronchitis, unspecified: Secondary | ICD-10-CM

## 2014-06-30 DIAGNOSIS — J4521 Mild intermittent asthma with (acute) exacerbation: Secondary | ICD-10-CM

## 2014-06-30 MED ORDER — HYDROCOD POLST-CPM POLST ER 10-8 MG/5ML PO SUER: 5.0000 mL | Freq: Every evening | ORAL | Status: DC | PRN

## 2014-06-30 MED ORDER — ALBUTEROL SULFATE 108 (90 BASE) MCG/ACT IN AEPB: 2.0000 | INHALATION_SPRAY | RESPIRATORY_TRACT | Status: DC | PRN

## 2014-06-30 MED ORDER — BENZONATATE 200 MG PO CAPS: 200.0000 mg | ORAL_CAPSULE | Freq: Three times a day (TID) | ORAL | Status: DC | PRN

## 2014-06-30 MED ORDER — ALBUTEROL SULFATE (2.5 MG/3ML) 0.083% IN NEBU: 2.5000 mg | INHALATION_SOLUTION | Freq: Once | RESPIRATORY_TRACT | Status: DC

## 2014-06-30 MED ORDER — AZITHROMYCIN 250 MG PO TABS: ORAL_TABLET | ORAL | Status: DC

## 2014-06-30 MED ORDER — PREDNISONE 20 MG PO TABS: 40.0000 mg | ORAL_TABLET | Freq: Every day | ORAL | Status: DC

## 2014-06-30 MED ORDER — IPRATROPIUM BROMIDE 0.02 % IN SOLN: 0.5000 mg | Freq: Once | RESPIRATORY_TRACT | Status: DC

## 2014-06-30 MED ORDER — MUCINEX DM MAXIMUM STRENGTH 60-1200 MG PO TB12: 1.0000 | ORAL_TABLET | Freq: Two times a day (BID) | ORAL | Status: DC

## 2014-06-30 NOTE — Progress Notes (Signed)
Subjective:    Patient ID: Nathaniel Soto, male    DOB: 1985/12/16, 29 y.o.   MRN: [id number] This chart was scribed for Shawnee Knapp, MD by Girtha Hake, ED Scribe. The patient's care was started at 3:31 PM.   Chief Complaint  Patient presents with  . Cough    yellow mucus - all symptoms x 2 days  . Shortness of Breath    HPI Nathaniel Soto is a 29 y.o. Male. He was last seen here in December 2014. Patient complains of congestion and a productive cough beginning two days ago. He also complains of difficulty inspiring deeply. He reports that he has taken Mucinex DM and used Affrin nasal spray with minimal relief of symptoms. He denies fever, chills, ear pain, sinus pressure, sore throat, or sick exposure.      Past Medical History  Diagnosis Date  . ADHD (attention deficit hyperactivity disorder)    Current Outpatient Prescriptions on File Prior to Visit  Medication Sig Dispense Refill  . lisdexamfetamine (VYVANSE) 20 MG capsule Take 1 capsule (20 mg total) by mouth every morning. May fill on or after 05/01/2013. 30 capsule 0  . ipratropium (ATROVENT) 0.03 % nasal spray Place 2 sprays into the nose 2 (two) times daily. (Patient not taking: Reported on 06/30/2014) 30 mL 5   No current facility-administered medications on file prior to visit.   No Known Allergies    Review of Systems  Constitutional: Negative for fever and chills.  HENT: Positive for congestion. Negative for ear pain, sinus pressure and sore throat.   Respiratory: Positive for cough.        Objective:   Physical Exam  Constitutional: He is oriented to person, place, and time. He appears well-developed and well-nourished. No distress.  HENT:  Head: Normocephalic and atraumatic.  Nose red with rhinorrhea. 2+ tonsils. Moderate amount of postnasal drip.  Eyes: Conjunctivae and EOM are normal.  Neck: Neck supple. No tracheal deviation present. No thyromegaly present.  Cardiovascular: Normal rate,  regular rhythm and normal heart sounds.   No murmur heard. Normal S1 and S2.  Pulmonary/Chest: Effort normal. No respiratory distress. He has wheezes.  Lungs with decreased air movement throughout. Lungs clear. Decreased expiratory phase with forced expiratory wheezing.  Musculoskeletal: Normal range of motion.  Neurological: He is alert and oriented to person, place, and time.  Skin: Skin is warm and dry.  Psychiatric: He has a normal mood and affect. His behavior is normal.  Nursing note and vitals reviewed.  Filed Vitals:   06/30/14 1450  BP: 101/71  Pulse: 67  Temp: 97.7 F (36.5 C)  Resp: 16          Assessment & Plan:   1. Acute bronchitis, unspecified organism   2. Reactive airway disease, mild intermittent, with acute exacerbation      Meds ordered this encounter  Medications  . albuterol (PROVENTIL) (2.5 MG/3ML) 0.083% nebulizer solution 2.5 mg    Sig:   . ipratropium (ATROVENT) nebulizer solution 0.5 mg    Sig:   . azithromycin (ZITHROMAX) 250 MG tablet    Sig: Take 2 tabs PO x 1 dose, then 1 tab PO QD x 4 days    Dispense:  6 tablet    Refill:  0  . DISCONTD: predniSONE (DELTASONE) 20 MG tablet    Sig: Take 2 tablets (40 mg total) by mouth daily with breakfast.    Dispense:  10 tablet    Refill:  0  .  Dextromethorphan-Guaifenesin (MUCINEX DM MAXIMUM STRENGTH) 60-1200 MG TB12    Sig: Take 1 tablet by mouth every 12 (twelve) hours.    Dispense:  14 each    Refill:  1  . chlorpheniramine-HYDROcodone (TUSSIONEX PENNKINETIC ER) 10-8 MG/5ML SUER    Sig: Take 5 mLs by mouth at bedtime as needed for cough.    Dispense:  90 mL    Refill:  0  . benzonatate (TESSALON) 200 MG capsule    Sig: Take 1 capsule (200 mg total) by mouth 3 (three) times daily as needed for cough.    Dispense:  40 capsule    Refill:  0  . Albuterol Sulfate (PROAIR RESPICLICK) 309 (90 BASE) MCG/ACT AEPB    Sig: Inhale 2 puffs into the lungs every 4 (four) hours as needed (cough, wheeze,  short of breath.).    Dispense:  1 each    Refill:  1  . predniSONE (DELTASONE) 20 MG tablet    Sig: Take 2 tablets (40 mg total) by mouth daily with breakfast. Take 3 tabs po qd with breakfast x 2d, take 2 tabs po qd x 3d.    Dispense:  12 tablet    Refill:  0    I personally performed the services described in this documentation, which was scribed in my presence. The recorded information has been reviewed and considered, and addended by me as needed.  Delman Cheadle, MD MPH

## 2014-08-02 ENCOUNTER — Ambulatory Visit (INDEPENDENT_AMBULATORY_CARE_PROVIDER_SITE_OTHER): Payer: 59

## 2014-08-02 ENCOUNTER — Ambulatory Visit (INDEPENDENT_AMBULATORY_CARE_PROVIDER_SITE_OTHER): Payer: 59 | Admitting: Emergency Medicine

## 2014-08-02 VITALS — BP 132/70 | HR 55 | Temp 97.9°F | Resp 18 | Ht 69.0 in | Wt 214.0 lb

## 2014-08-02 DIAGNOSIS — R05 Cough: Secondary | ICD-10-CM

## 2014-08-02 DIAGNOSIS — R059 Cough, unspecified: Secondary | ICD-10-CM

## 2014-08-02 DIAGNOSIS — M898X9 Other specified disorders of bone, unspecified site: Secondary | ICD-10-CM | POA: Diagnosis not present

## 2014-08-02 DIAGNOSIS — J45901 Unspecified asthma with (acute) exacerbation: Secondary | ICD-10-CM | POA: Insufficient documentation

## 2014-08-02 DIAGNOSIS — J4531 Mild persistent asthma with (acute) exacerbation: Secondary | ICD-10-CM | POA: Diagnosis not present

## 2014-08-02 DIAGNOSIS — D1602 Benign neoplasm of scapula and long bones of left upper limb: Secondary | ICD-10-CM

## 2014-08-02 MED ORDER — MONTELUKAST SODIUM 10 MG PO TABS: 10.0000 mg | ORAL_TABLET | Freq: Every day | ORAL | Status: DC

## 2014-08-02 MED ORDER — PREDNISONE 10 MG PO TABS: ORAL_TABLET | ORAL | Status: DC

## 2014-08-02 NOTE — Progress Notes (Signed)
   Subjective:   This chart was scribed for Nathaniel Jordan, MD by Thea Alken, ED Scribe. This patient was seen in room 9 and the patient's care was started at 10:17 AM.   Patient ID: Nathaniel Soto, male    DOB: 18-Oct-1985, 29 y.o.   MRN: [id number]  HPI Chief Complaint  Patient presents with  . Follow-up   HPI Comments: Nathaniel Soto is a 29 y.o. male who [facility] for a follow up regarding bronchitis. Pt states he was seen here 1 month ago by Dr. Brigitte Pulse and was prescribed prednisone, Zithromax and an inhaler after being diagnosed with bronchitis. He returns today with resolved SOB but states cough, congestion and fatigue have persisted since last visit. He denies being diagnosed with asthma and allergies in the past. Pt has cats at home that he's had for 2 years. He reports he's lived with cats for several years and has had a reaction in the past. He denies working in environment causing symptoms. He denies sick contacts. Pt denies having fever.   Past Medical History  Diagnosis Date  . ADHD (attention deficit hyperactivity disorder)    History reviewed. No pertinent past surgical history. Prior to Admission medications   Medication Sig Start Date End Date Taking? Authorizing Provider  Albuterol Sulfate (PROAIR RESPICLICK) 810 (90 BASE) MCG/ACT AEPB Inhale 2 puffs into the lungs every 4 (four) hours as needed (cough, wheeze, short of breath.). 06/30/14  Yes Shawnee Knapp, MD  lisdexamfetamine (VYVANSE) 20 MG capsule Take 1 capsule (20 mg total) by mouth every morning. May fill on or after 05/01/2013. Patient not taking: Reported on 08/02/2014 12/16/13   Darreld Mclean, MD   Review of Systems  Constitutional: Positive for fatigue. Negative for fever and chills.  HENT: Positive for congestion.   Respiratory: Positive for cough. Negative for shortness of breath.    Objective:   Physical Exam  CONSTITUTIONAL: Well developed/well nourished HEAD:  Normocephalic/atraumatic EYES: EOMI/PERRL ENMT: Mucous membranes moist NECK: supple no meningeal signs SPINE/BACK:entire spine nontender CV: S1/S2 noted, no murmurs/rubs/gallops noted LUNGS: Lungs are clear to auscultation bilaterally, no apparent distress ABDOMEN: soft, nontender, no rebound or guarding, bowel sounds noted throughout abdomen GU:no cva tenderness NEURO: Pt is awake/alert/appropriate, moves all extremitiesx4.  No facial droop.   EXTREMITIES: pulses normal/equal, full ROM SKIN: warm, color normal PSYCH: no abnormalities of mood noted, alert and oriented to situation  Filed Vitals:   08/02/14 0940  BP: 132/70  Pulse: 55  Temp: 97.9 F (36.6 C)  TempSrc: Oral  Resp: 18  Height: 5\' 9"  (1.753 m)  Weight: 214 lb (97.07 kg)  SpO2: 99%  PF: 540 L/min   [facility] reading (PRIMARY) by Dr. Everlene Farrier. CXR there appear to be increased basilar markings. There is also a 1-1/2 cm  lesion medial border of the left scapula.  Assessment & Plan:  Patient does have a lesion medial border of the scapula. His symptoms sound to me to be allergic related. I placed him on a short taper of prednisone along with Singulair at night recheck 1 month.I I would recommend repeat imaging of his left scapula in 6 monthspersonally performed the services described in this documentation, which was scribed in my presence. The recorded information has been reviewed and is accurate.  Nathaniel Jordan, MD

## 2014-08-02 NOTE — Progress Notes (Signed)
° °  Subjective:   This chart was scribed for Nena Jordan, MD by Thea Alken, ED Scribe. This patient was seen in room 9 and the patient's care was started at 10:17 AM.   Patient ID: Nathaniel Soto, male    DOB: 1985/04/22, 29 y.o.   MRN: [id number]  HPI Chief Complaint  Patient presents with   Follow-up   HPI Comments: Nathaniel Soto is a 29 y.o. male who [facility] for a follow up regarding bronchitis. Pt states he was seen here 1 month ago by Dr. Brigitte Pulse and was prescribed prednisone, Zithromax and an inhaler after being diagnosed with bronchitis. He returns today with resolved SOB but states cough, congestion and fatigue have persisted since last visit. He denies being diagnosed with asthma and allergies in the past. Pt has cats at home that he's had for 2 years. He reports he's lived with cats for several years and has had a reaction in the past. He denies working in environment causing symptoms. He denies sick contacts. Pt denies having fever.   Past Medical History  Diagnosis Date   ADHD (attention deficit hyperactivity disorder)    History reviewed. No pertinent past surgical history. Prior to Admission medications   Medication Sig Start Date End Date Taking? Authorizing Provider  Albuterol Sulfate (PROAIR RESPICLICK) 742 (90 BASE) MCG/ACT AEPB Inhale 2 puffs into the lungs every 4 (four) hours as needed (cough, wheeze, short of breath.). 06/30/14  Yes Shawnee Knapp, MD  lisdexamfetamine (VYVANSE) 20 MG capsule Take 1 capsule (20 mg total) by mouth every morning. May fill on or after 05/01/2013. Patient not taking: Reported on 08/02/2014 12/16/13   Darreld Mclean, MD   Review of Systems  Constitutional: Positive for fatigue. Negative for fever and chills.  HENT: Positive for congestion.   Respiratory: Positive for cough. Negative for shortness of breath.    Objective:   Physical Exam  CONSTITUTIONAL: Well developed/well nourished HEAD:  Normocephalic/atraumatic EYES: EOMI/PERRL ENMT: Mucous membranes moist NECK: supple no meningeal signs SPINE/BACK:entire spine nontender CV: S1/S2 noted, no murmurs/rubs/gallops noted LUNGS: Lungs are clear to auscultation bilaterally, no apparent distress ABDOMEN: soft, nontender, no rebound or guarding, bowel sounds noted throughout abdomen GU:no cva tenderness NEURO: Pt is awake/alert/appropriate, moves all extremitiesx4.  No facial droop.   EXTREMITIES: pulses normal/equal, full ROM SKIN: warm, color normal PSYCH: no abnormalities of mood noted, alert and oriented to situation  Filed Vitals:   08/02/14 0940  BP: 132/70  Pulse: 55  Temp: 97.9 F (36.6 C)  TempSrc: Oral  Resp: 18  Height: 5\' 9"  (1.753 m)  Weight: 214 lb (97.07 kg)  SpO2: 99%   [facility] reading (PRIMARY) by Dr. Everlene Farrier. CXR there appear to be increased basilar markings. There is also a 1-1/2 cm  lesion medial border of the left scapula.  Assessment & Plan:

## 2014-08-02 NOTE — Patient Instructions (Signed)
Recheck one month. You need a x-ray of your scapula in 6 months to assure stability of the area seen on your scapula. Please wash all of the bedclothes in your room and make sure your Does not go in your bedroom.

## 2014-09-04 ENCOUNTER — Ambulatory Visit (INDEPENDENT_AMBULATORY_CARE_PROVIDER_SITE_OTHER): Payer: 59 | Admitting: Family Medicine

## 2014-09-04 ENCOUNTER — Encounter: Payer: Self-pay | Admitting: Family Medicine

## 2014-09-04 VITALS — BP 110/62 | HR 62 | Temp 98.5°F | Resp 16 | Ht 68.75 in | Wt 209.8 lb

## 2014-09-04 DIAGNOSIS — F902 Attention-deficit hyperactivity disorder, combined type: Secondary | ICD-10-CM

## 2014-09-04 DIAGNOSIS — J342 Deviated nasal septum: Secondary | ICD-10-CM | POA: Diagnosis not present

## 2014-09-04 DIAGNOSIS — J309 Allergic rhinitis, unspecified: Secondary | ICD-10-CM

## 2014-09-04 DIAGNOSIS — R059 Cough, unspecified: Secondary | ICD-10-CM

## 2014-09-04 DIAGNOSIS — R05 Cough: Secondary | ICD-10-CM | POA: Diagnosis not present

## 2014-09-04 MED ORDER — LISDEXAMFETAMINE DIMESYLATE 10 MG PO CAPS: 10.0000 mg | ORAL_CAPSULE | Freq: Every day | ORAL | Status: DC

## 2014-09-04 NOTE — Patient Instructions (Signed)
Try zyrtec instead of Claritin D, if you need decongestant, take plain sudafed (generic fine for all of these) not extended release.  Can use some Afrin, especially at bedtime before using flonase. Can use for up to 3 days, then take a break for a couple of days.

## 2014-09-04 NOTE — Progress Notes (Signed)
Subjective:    Patient ID: Nathaniel Soto, male    DOB: 06/30/1985, 29 y.o.   MRN: [id number]  HPI Patient presents today for follow up of several months of chest congestion/ cough. At his last visit at [facility] (08/02/14), he was given prednisone and singular. He has noticed decreased chest congestion, but continues to have nasal congestion. Has occasional dry/sore throat and ear pressure. Clear nasal drainage. No facial pain. Takes Claritin D 24 hour daily and using flonase daily. Saw ENT three weeks ago and who recommended he have sinoplasty and tonsillectomy. He is interested in a second opinion prior to undergoing surgery. He feels like he is able to do his normal activities. Denies SOB, wheeze. Cough seems to come from top of throat. Not very frequent.   He is currently in school (online) and working at a [employer]. He has been taking his Vyvanse 20 mg intermittently to help with studies. He takes during the week, rarely on the weekends. He would like to try a lower dose due to having some increased anxiety on the 20 mg dose. He has also been taking Claritin D 24 daily for awhile and he drinks several cups of coffee in the morning. He sleeps well and tries to get some regular activity/exercise most days of the week. Has been cooking at home more and increasing activity with some resulting weight loss.   Review of Systems No chest pain, no SOB, no palpitations, no fever/chills, no headache, + clear nasal drainage, + sore throat, + ear pressure.     Objective:   Physical Exam  Constitutional: He is oriented to person, place, and time. He appears well-developed and well-nourished.  HENT:  Head: Normocephalic and atraumatic.  Right Ear: External ear and ear canal normal. Tympanic membrane is scarred.  Left Ear: Tympanic membrane, external ear and ear canal normal.  Nose: Mucosal edema, rhinorrhea and septal deviation present. Right sinus exhibits no maxillary sinus tenderness and no frontal  sinus tenderness. Left sinus exhibits frontal sinus tenderness. Left sinus exhibits no maxillary sinus tenderness.  Mouth/Throat: No oropharyngeal exudate, posterior oropharyngeal edema, posterior oropharyngeal erythema or tonsillar abscesses.  +2 tonsils   Neck: Normal range of motion. Neck supple.  Cardiovascular: Normal rate, regular rhythm and normal heart sounds.   Pulmonary/Chest: Effort normal and breath sounds normal.  Musculoskeletal: Normal range of motion.  Lymphadenopathy:    He has no cervical adenopathy.  Neurological: He is alert and oriented to person, place, and time.  Skin: Skin is warm and dry.  Psychiatric: He has a normal mood and affect. His behavior is normal. Judgment and thought content normal.  Vitals reviewed.  BP 110/62 mmHg  Pulse 62  Temp(Src) 98.5 F (36.9 C) (Oral)  Resp 16  Ht 5' 8.75" (1.746 m)  Wt 209 lb 12.8 oz (95.165 kg)  BMI 31.22 kg/m2  SpO2 98% Wt Readings from Last 3 Encounters:  09/04/14 209 lb 12.8 oz (95.165 kg)  08/02/14 214 lb (97.07 kg)  06/30/14 215 lb 9.6 oz (97.796 kg)      Assessment & Plan:  1. Attention deficit hyperactivity disorder (ADHD), combined type - Lisdexamfetamine Dimesylate 10 MG CAPS; Take 10 mg by mouth daily.  Dispense: 30 capsule; Refill: 0 - discussed how combination of decongestant, caffeine and vyvanse may be increasing his anxiety. Suggested he take OTC zyrtec and shorter acting decongestant.  - He will let me know how he does on this dose. Reminded him that he will need to be  seen every 6 months for refills for ADHD management.   2. Cough - this has resolved  3. Allergic rhinitis, unspecified allergic rhinitis type - Ambulatory referral to ENT- to [facility] for second opinion regarding surgery - Discussed things he can do in the mean time to try to manage symptoms better- switch to zyrtec and pseudoephedrine, try Afrin for up to 3 days (suggested he use this prior to flonase), saline nasal spray/neti  pot.  4. Nasal septal deviation - Ambulatory referral to ENT   Clarene Reamer, FNP-BC  [facility]  09/05/2014 7:33 PM

## 2014-09-05 ENCOUNTER — Telehealth: Payer: Self-pay

## 2014-09-05 NOTE — Telephone Encounter (Signed)
PA completed for Vyvanse 10 mg on covermymeds. Pending.

## 2014-09-06 NOTE — Telephone Encounter (Signed)
PA approved through 09/05/15. Notified pharm.

## 2014-09-18 ENCOUNTER — Ambulatory Visit: Payer: Self-pay | Admitting: Family

## 2014-10-20 ENCOUNTER — Telehealth: Payer: Self-pay

## 2014-10-20 NOTE — Telephone Encounter (Signed)
Attempted to call patient. No answer. Sent him a [employer]- I will leave him a prescription at 104 on Monday, 10/23/14.

## 2014-10-20 NOTE — Telephone Encounter (Signed)
Pt would like a refill on his lisdexamfetamine (VYVANSE) 20 MG capsule [[id number]] DISCONTINUED. He would like the 20mg . He said he spoke with Tor Netters on his last visit. Please advise at [phone]

## 2014-10-23 ENCOUNTER — Other Ambulatory Visit: Payer: Self-pay | Admitting: Family Medicine

## 2014-10-23 DIAGNOSIS — F902 Attention-deficit hyperactivity disorder, combined type: Secondary | ICD-10-CM

## 2014-10-23 MED ORDER — LISDEXAMFETAMINE DIMESYLATE 20 MG PO CAPS: 20.0000 mg | ORAL_CAPSULE | Freq: Every day | ORAL | Status: DC

## 2014-10-23 NOTE — Progress Notes (Unsigned)
Prescriptions printed and at 104 for pick up.

## 2014-10-23 NOTE — Progress Notes (Signed)
Patient is aware 

## 2014-12-11 ENCOUNTER — Ambulatory Visit (INDEPENDENT_AMBULATORY_CARE_PROVIDER_SITE_OTHER): Payer: 59 | Admitting: Physician Assistant

## 2014-12-11 ENCOUNTER — Encounter: Payer: Self-pay | Admitting: Physician Assistant

## 2014-12-11 VITALS — BP 103/67 | HR 76 | Temp 98.6°F | Resp 16 | Ht 68.5 in | Wt 204.6 lb

## 2014-12-11 DIAGNOSIS — Z0000 Encounter for general adult medical examination without abnormal findings: Secondary | ICD-10-CM

## 2014-12-11 DIAGNOSIS — Z130 Encounter for screening for diseases of the blood and blood-forming organs and certain disorders involving the immune mechanism: Secondary | ICD-10-CM | POA: Diagnosis not present

## 2014-12-11 DIAGNOSIS — Z113 Encounter for screening for infections with a predominantly sexual mode of transmission: Secondary | ICD-10-CM | POA: Diagnosis not present

## 2014-12-11 DIAGNOSIS — Z13220 Encounter for screening for lipoid disorders: Secondary | ICD-10-CM | POA: Diagnosis not present

## 2014-12-11 DIAGNOSIS — Z23 Encounter for immunization: Secondary | ICD-10-CM | POA: Diagnosis not present

## 2014-12-11 DIAGNOSIS — Z13228 Encounter for screening for other metabolic disorders: Secondary | ICD-10-CM

## 2014-12-11 LAB — COMPLETE METABOLIC PANEL WITH GFR
ALBUMIN: 4.5 g/dL (ref 3.6–5.1)
ALT: 18 U/L (ref 9–46)
AST: 13 U/L (ref 10–40)
Alkaline Phosphatase: 92 U/L (ref 40–115)
BILIRUBIN TOTAL: 0.3 mg/dL (ref 0.2–1.2)
BUN: 15 mg/dL (ref 7–25)
CO2: 28 mmol/L (ref 20–31)
CREATININE: 0.88 mg/dL (ref 0.60–1.35)
Calcium: 9.4 mg/dL (ref 8.6–10.3)
Chloride: 102 mmol/L (ref 98–110)
GFR, Est African American: >89 mL/min (ref >=60)
GFR, Est Non African American: >89 mL/min (ref >=60)
GLUCOSE: 78 mg/dL (ref 65–99)
Potassium: 4.2 mmol/L (ref 3.5–5.3)
SODIUM: 139 mmol/L (ref 135–146)
TOTAL PROTEIN: 7.3 g/dL (ref 6.1–8.1)

## 2014-12-11 LAB — LIPID PANEL
Cholesterol: 159 mg/dL (ref 125–200)
HDL: 27 mg/dL — ABNORMAL LOW (ref >=40)
LDL CALC: 99 mg/dL (ref <130)
Total CHOL/HDL Ratio: 5.9 Ratio — ABNORMAL HIGH (ref <=5.0)
Triglycerides: 163 mg/dL — ABNORMAL HIGH (ref <150)
VLDL: 33 mg/dL — ABNORMAL HIGH (ref <30)

## 2014-12-11 LAB — CBC
HCT: 39.1 % (ref 39.0–52.0)
Hemoglobin: 13.2 g/dL (ref 13.0–17.0)
MCH: 26.8 pg (ref 26.0–34.0)
MCHC: 33.8 g/dL (ref 30.0–36.0)
MCV: 79.5 fL (ref 78.0–100.0)
MPV: 10.8 fL (ref 8.6–12.4)
PLATELETS: 271 10*3/uL (ref 150–400)
RBC: 4.92 MIL/uL (ref 4.22–5.81)
RDW: 13.9 % (ref 11.5–15.5)
WBC: 9.4 10*3/uL (ref 4.0–10.5)

## 2014-12-11 NOTE — Patient Instructions (Signed)

## 2014-12-12 LAB — RPR

## 2014-12-12 LAB — HIV ANTIBODY (ROUTINE TESTING W REFLEX): HIV 1&2 Ab, 4th Generation: NONREACTIVE

## 2014-12-15 NOTE — Progress Notes (Signed)
[facility] [address] [place] [address] [phone]  Date:  12/11/2014   Name:  Nathaniel Soto   DOB:  [birth year]   MRN:  [id number]  PCP:  Delman Cheadle, MD    History of Present Illness:  Nathaniel Soto is a 29 y.o. male patient who presents to [facility] for annual physical exam.  BM: No diarrhea, or constipation, or blood in the stool.  Urination: No dysuria, hematuria, or frequency  Sleep: Improved, with a more regimented schedule.  Able to get to sleep better.  EtOH: Limited Illicit drug use: None Tobacco use: None, previous smoker     Patient Active Problem List   Diagnosis Date Noted  . Asthma with acute exacerbation 08/02/2014  . Bony exostosis 08/02/2014  . Attention deficit hyperactivity disorder (ADHD) 04/02/2012  . Anxiety 08/16/2010    Past Medical History  Diagnosis Date  . ADHD (attention deficit hyperactivity disorder)     No past surgical history on file.  Social History  Substance Use Topics  . Smoking status: Former Smoker -- 0.50 packs/day for 1 years    Types: Cigarettes    Quit date: 07/11/2009  . Smokeless tobacco: None  . Alcohol Use: 1.2 oz/week    2 Cans of beer per week    Family History  Problem Relation Age of Onset  . Diabetes Paternal Grandmother   . Alcoholism Mother     No Known Allergies  Medication list has been reviewed and updated.  Current Outpatient Prescriptions on File Prior to Visit  Medication Sig Dispense Refill  . Albuterol Sulfate (PROAIR RESPICLICK) 518 (90 BASE) MCG/ACT AEPB Inhale 2 puffs into the lungs every 4 (four) hours as needed (cough, wheeze, short of breath.). 1 each 1  . azelastine (ASTELIN) 0.1 % nasal spray INSTILL 1 OR 2 SPAYS IEN BID  6  . fluticasone (FLONASE) 50 MCG/ACT nasal spray Place into both nostrils daily.    Marland Kitchen lisdexamfetamine (VYVANSE) 20 MG capsule Take 1 capsule (20 mg total) by mouth daily. 30 capsule 0  . lisdexamfetamine (VYVANSE) 20 MG capsule Take 1  capsule (20 mg total) by mouth daily. 30 capsule 0  . lisdexamfetamine (VYVANSE) 20 MG capsule Take 1 capsule (20 mg total) by mouth daily. 30 capsule 0   No current facility-administered medications on file prior to visit.    Review of Systems  Constitutional: Negative for fever and chills.  HENT: Negative for ear discharge, ear pain and sore throat.   Eyes: Negative for blurred vision and double vision.  Respiratory: Negative for cough, shortness of breath and wheezing.   Cardiovascular: Negative for chest pain, palpitations and leg swelling.  Gastrointestinal: Negative for nausea, vomiting and diarrhea.  Genitourinary: Negative for dysuria, frequency and hematuria.  Skin: Negative for itching and rash.  Neurological: Negative for dizziness and headaches.     Physical Examination: BP 103/67 mmHg  Pulse 76  Temp(Src) 98.6 F (37 C) (Oral)  Resp 16  Ht 5' 8.5" (1.74 m)  Wt 204 lb 9.6 oz (92.806 kg)  BMI 30.65 kg/m2 Ideal Body Weight: Weight in (lb) to have BMI = 25: 166.5  Physical Exam  Constitutional: He is oriented to person, place, and time. He appears well-developed and well-nourished. No distress.  HENT:  Head: Normocephalic and atraumatic.  Right Ear: Tympanic membrane, external ear and ear canal normal.  Left Ear: Tympanic membrane, external ear and ear canal normal.  Eyes: Conjunctivae and EOM are normal. Pupils are equal, round,  and reactive to light.  Cardiovascular: Normal rate and regular rhythm.  Exam reveals no friction rub.   No murmur heard. Pulmonary/Chest: Effort normal. No respiratory distress. He has no wheezes.  Abdominal: Soft. Bowel sounds are normal. He exhibits no distension and no mass. There is no tenderness.  Musculoskeletal: Normal range of motion. He exhibits no edema or tenderness.  Neurological: He is alert and oriented to person, place, and time. He displays normal reflexes.  Skin: Skin is warm and dry. He is not diaphoretic.  Psychiatric:  He has a normal mood and affect. His behavior is normal.     Assessment and Plan: Nathaniel Soto is a 29 y.o. male who is here today for annual physical exam. -general labs done, and completed for biometric insurance. -paperwork sent to given number via fax  1. Annual physical exam - COMPLETE METABOLIC PANEL WITH GFR - HIV antibody - Lipid panel - RPR - CBC - Flu Vaccine QUAD 36+ mos IM - Tdap vaccine greater than or equal to 7yo IM  2. Screening for STD (sexually transmitted disease) - HIV antibody - RPR  3. Screening for deficiency anemia - CBC  4. Screening for metabolic disorder - COMPLETE METABOLIC PANEL WITH GFR  5. Screening for lipid disorders - Lipid panel  6. Need for prophylactic vaccination and inoculation against influenza - Flu Vaccine QUAD 36+ mos IM  7. Need for Tdap vaccination - Tdap vaccine greater than or equal to 7yo IM   Ivar Drape, PA-C [facility] 12/15/2014 3:02 PM

## 2015-02-02 ENCOUNTER — Telehealth: Payer: Self-pay | Admitting: Family Medicine

## 2015-02-02 DIAGNOSIS — F902 Attention-deficit hyperactivity disorder, combined type: Secondary | ICD-10-CM

## 2015-02-06 MED ORDER — LISDEXAMFETAMINE DIMESYLATE 20 MG PO CAPS: 20.0000 mg | ORAL_CAPSULE | Freq: Every day | ORAL | Status: DC

## 2015-02-06 NOTE — Telephone Encounter (Signed)
Rx printed at 104. Will bring to 102 after clinic.  Needs visit with Ms. Carlean Purl for additional fills.  Meds ordered this encounter  Medications  . lisdexamfetamine (VYVANSE) 20 MG capsule    Sig: Take 1 capsule (20 mg total) by mouth daily.    Dispense:  30 capsule    Refill:  0    Order Specific Question:  Supervising Provider    Answer:  DOOLITTLE, ROBERT P [id number]

## 2015-02-06 NOTE — Telephone Encounter (Signed)
Rx ready to pick up

## 2015-02-06 NOTE — Addendum Note (Signed)
Addended by: Fara Chute on: 02/06/2015 11:16 AM   Modules accepted: Orders

## 2015-04-10 ENCOUNTER — Other Ambulatory Visit: Payer: Self-pay | Admitting: Physician Assistant

## 2015-04-10 DIAGNOSIS — F902 Attention-deficit hyperactivity disorder, combined type: Secondary | ICD-10-CM

## 2015-04-11 NOTE — Telephone Encounter (Signed)
So, This patient's PCP is listed as Dr. Brigitte Pulse, but his visits over the past year have been with Tor Netters and one with Ivar Drape. I authorized a fill in 01/2015, with notation that he needed to see Ms. Gessner for additional medication fills.  Please advise the patient to RTC to see either Dr. Brigitte Pulse or Ms. Carlean Purl.

## 2015-04-18 ENCOUNTER — Ambulatory Visit (INDEPENDENT_AMBULATORY_CARE_PROVIDER_SITE_OTHER): Payer: BLUE CROSS/BLUE SHIELD | Admitting: Family Medicine

## 2015-04-18 VITALS — BP 112/70 | HR 61 | Temp 98.3°F | Resp 17 | Ht 69.0 in | Wt 209.0 lb

## 2015-04-18 DIAGNOSIS — F902 Attention-deficit hyperactivity disorder, combined type: Secondary | ICD-10-CM

## 2015-04-18 MED ORDER — LISDEXAMFETAMINE DIMESYLATE 20 MG PO CAPS: 20.0000 mg | ORAL_CAPSULE | Freq: Every day | ORAL | Status: DC

## 2015-04-18 NOTE — Progress Notes (Signed)
Subjective:  By signing my name below, I, Essence Howell, attest that this documentation has been prepared under the direction and in the presence of Delman Cheadle, MD Electronically Signed: Ladene Artist, ED Scribe 04/18/2015 at 10:00 AM.   Patient ID: Nathaniel Soto, male    DOB: [birth year], 30 y.o.   MRN: [id number]  Chief Complaint  Patient presents with  . Medication Refill    vyvanse    HPI HPI Comments: Nathaniel Soto is a 30 y.o. male, with a h/o ADHD, who [facility] medication refill of Vyvanse. Pt states that he has been doing well overall. He reports that the medication has worked well for him. States that he takes the medication almost every day, except for when he's off work unless he has a lot going on. Pt denies difficulty sleeping and any other symptoms at this time.   Past Medical History  Diagnosis Date  . ADHD (attention deficit hyperactivity disorder)    Current Outpatient Prescriptions on File Prior to Visit  Medication Sig Dispense Refill  . Albuterol Sulfate (PROAIR RESPICLICK) 123XX123 (90 BASE) MCG/ACT AEPB Inhale 2 puffs into the lungs every 4 (four) hours as needed (cough, wheeze, short of breath.). 1 each 1  . azelastine (ASTELIN) 0.1 % nasal spray INSTILL 1 OR 2 SPAYS IEN BID  6  . fluticasone (FLONASE) 50 MCG/ACT nasal spray Place into both nostrils daily.    Marland Kitchen lisdexamfetamine (VYVANSE) 20 MG capsule Take 1 capsule (20 mg total) by mouth daily. 30 capsule 0  . lisdexamfetamine (VYVANSE) 20 MG capsule Take 1 capsule (20 mg total) by mouth daily. 30 capsule 0  . lisdexamfetamine (VYVANSE) 20 MG capsule Take 1 capsule (20 mg total) by mouth daily. 30 capsule 0   No current facility-administered medications on file prior to visit.   No Known Allergies  Review of Systems  Constitutional: Negative for fever, chills, diaphoresis, activity change, appetite change, fatigue and unexpected weight change.  Respiratory: Negative  for chest tightness and shortness of breath.   Cardiovascular: Negative for chest pain and palpitations.  Gastrointestinal: Negative for nausea and vomiting.  Neurological: Negative for dizziness, tremors, syncope and light-headedness.  Psychiatric/Behavioral: Positive for decreased concentration. Negative for suicidal ideas, hallucinations, behavioral problems, confusion, sleep disturbance, self-injury, dysphoric mood and agitation. The patient is not nervous/anxious and is not hyperactive.    BP 112/70 mmHg  Pulse 61  Temp(Src) 98.3 F (36.8 C) (Oral)  Resp 17  Ht 5\' 9"  (1.753 m)  Wt 209 lb (94.802 kg)  BMI 30.85 kg/m2  SpO2 98%    Objective:   Physical Exam  Constitutional: He is oriented to person, place, and time. He appears well-developed and well-nourished. No distress.  HENT:  Head: Normocephalic and atraumatic.  Eyes: Conjunctivae and EOM are normal.  Neck: Neck supple. No tracheal deviation present.  Cardiovascular: Normal rate, regular rhythm, S1 normal, S2 normal and normal heart sounds.   Pulmonary/Chest: Effort normal and breath sounds normal. No respiratory distress.  Musculoskeletal: Normal range of motion.  Neurological: He is alert and oriented to person, place, and time.  Skin: Skin is warm and dry.  Psychiatric: He has a normal mood and affect. His behavior is normal.  Nursing note and vitals reviewed.     Assessment & Plan:   1. Attention deficit hyperactivity disorder (ADHD), combined type   Call in 3 mos for 3 additional refills as long as he is stable. Recheck in 6  mos.  Meds ordered this encounter  Medications  . lisdexamfetamine (VYVANSE) 20 MG capsule    Sig: Take 1 capsule (20 mg total) by mouth daily.    Dispense:  30 capsule    Refill:  0    May fill 30 days after date on prescription  . lisdexamfetamine (VYVANSE) 20 MG capsule    Sig: Take 1 capsule (20 mg total) by mouth daily.    Dispense:  30 capsule    Refill:  0    May fill 60 days  after date on prescription  . lisdexamfetamine (VYVANSE) 20 MG capsule    Sig: Take 1 capsule (20 mg total) by mouth daily.    Dispense:  30 capsule    Refill:  0    I personally performed the services described in this documentation, which was scribed in my presence. The recorded information has been reviewed and considered, and addended by me as needed.  Delman Cheadle, MD MPH

## 2015-04-18 NOTE — Patient Instructions (Signed)
Ok to call in 3 months for an additional 3 months of refills.  We will see you back in September as long as you are doing well.  [facility] Policy for Prescribing Controlled Substances (Revised 12/2011) 1. Prescriptions for controlled substances will be filled by ONE provider at [facility] with whom you have established and developed a plan for your care, including follow-up. 2. You are encouraged to schedule an appointment with your prescriber at our appointment center for follow-up visits whenever possible. 3. If you request a prescription for the controlled substance while at [facility] for an acute problem (with someone other than your regular prescriber), you MAY be given a ONE-TIME prescription for a 30-day supply of the controlled substance, to allow time for you to return to see your regular prescriber for additional prescriptions.

## 2015-05-08 ENCOUNTER — Ambulatory Visit: Payer: 59 | Admitting: Family Medicine

## 2015-08-23 ENCOUNTER — Telehealth: Payer: Self-pay

## 2015-08-23 DIAGNOSIS — F902 Attention-deficit hyperactivity disorder, combined type: Secondary | ICD-10-CM

## 2015-08-23 NOTE — Telephone Encounter (Signed)
Pt is needing a refill on vyvanse   Best number [phone]

## 2015-08-29 MED ORDER — LISDEXAMFETAMINE DIMESYLATE 20 MG PO CAPS: 20.0000 mg | ORAL_CAPSULE | Freq: Every day | ORAL | Status: DC

## 2015-08-29 NOTE — Telephone Encounter (Signed)
LM Rx ready to pick up.

## 2015-08-29 NOTE — Telephone Encounter (Signed)
Printed and ready for pick up. 

## 2015-08-30 ENCOUNTER — Other Ambulatory Visit: Payer: Self-pay | Admitting: Physician Assistant

## 2015-08-30 DIAGNOSIS — F902 Attention-deficit hyperactivity disorder, combined type: Secondary | ICD-10-CM

## 2015-08-30 MED ORDER — LISDEXAMFETAMINE DIMESYLATE 20 MG PO CAPS: 20.0000 mg | ORAL_CAPSULE | Freq: Every day | ORAL | Status: DC

## 2015-08-30 NOTE — Progress Notes (Signed)
We cannot find the Rx in the office so I have reprinted for the patient.

## 2015-11-29 ENCOUNTER — Ambulatory Visit (INDEPENDENT_AMBULATORY_CARE_PROVIDER_SITE_OTHER): Payer: BLUE CROSS/BLUE SHIELD

## 2015-11-29 ENCOUNTER — Ambulatory Visit (INDEPENDENT_AMBULATORY_CARE_PROVIDER_SITE_OTHER): Payer: BLUE CROSS/BLUE SHIELD | Admitting: Family Medicine

## 2015-11-29 VITALS — BP 106/50 | HR 67 | Temp 98.5°F | Resp 18 | Ht 68.25 in | Wt 211.2 lb

## 2015-11-29 DIAGNOSIS — M79671 Pain in right foot: Secondary | ICD-10-CM

## 2015-11-29 DIAGNOSIS — M7751 Other enthesopathy of right foot: Secondary | ICD-10-CM | POA: Diagnosis not present

## 2015-11-29 MED ORDER — IBUPROFEN 800 MG PO TABS: 800.0000 mg | ORAL_TABLET | Freq: Three times a day (TID) | ORAL | 1 refills | Status: DC | PRN

## 2015-11-29 NOTE — Patient Instructions (Addendum)
  Your foot xrays are negative. This is good news.   I would recommend continuing to use the Ace bandage around the top of your foot as compression.  I have sent in 800 mg ibuprofen as pain relief.. This is also anti-inflammatory.  You can use ice for relief here as well.    I think that what's going on is tendinitis of your foot.  It was good to meet you today!  Tendinitis Tendinitis is swelling and inflammation of the tendons. Tendons are band-like tissues that connect muscle to bone. Tendinitis commonly occurs in the:   Shoulders (rotator cuff).  Heels (Achilles tendon).  Elbows (triceps tendon). CAUSES Tendinitis is usually caused by overusing the tendon, muscles, and joints involved. When the tissue surrounding a tendon (synovium) becomes inflamed, it is called tenosynovitis. Tendinitis commonly develops in people whose jobs require repetitive motions. SYMPTOMS  Pain.  Tenderness.  Mild swelling. DIAGNOSIS Tendinitis is usually diagnosed by physical exam. Your health care provider may also order X-rays or other imaging tests. TREATMENT Your health care provider may recommend certain medicines or exercises for your treatment. HOME CARE INSTRUCTIONS   Use a sling or splint for as long as directed by your health care provider until the pain decreases.  Put ice on the injured area.  Put ice in a plastic bag.  Place a towel between your skin and the bag.  Leave the ice on for 15-20 minutes, 3-4 times a day, or as directed by your health care provider.  Avoid using the limb while the tendon is painful. Perform gentle range of motion exercises only as directed by your health care provider. Stop exercises if pain or discomfort increase, unless directed otherwise by your health care provider.  Only take over-the-counter or prescription medicines for pain, discomfort, or fever as directed by your health care provider. SEEK MEDICAL CARE IF:   Your pain and swelling  increase.  You develop new, unexplained symptoms, especially increased numbness in the hands. MAKE SURE YOU:   Understand these instructions.  Will watch your condition.  Will get help right away if you are not doing well or get worse.   This information is not intended to replace advice given to you by your health care provider. Make sure you discuss any questions you have with your health care provider.   Document Released: 01/25/2000 Document Revised: 02/17/2014 Document Reviewed: 04/15/2010 Elsevier Interactive Patient Education 2016 [employer].    IF you received an x-ray today, you will receive an invoice from [facility]. Please contact [facility] at [phone] with questions or concerns regarding your invoice.   IF you received labwork today, you will receive an invoice from [employer]. Please contact [facility] at [phone] with questions or concerns regarding your invoice.   Our billing staff will not be able to assist you with questions regarding bills from these companies.  You will be contacted with the lab results as soon as they are available. The fastest way to get your results is to activate your My Chart account. Instructions are located on the last page of this paperwork. If you have not heard from [place] regarding the results in 2 weeks, please contact this office.

## 2015-11-29 NOTE — Progress Notes (Signed)
Nathaniel Soto is a 30 y.o. male who [facility] today for Right foot pain  1.  Right foot pain: Patient was hiking in [place] 10 days ago. On Saturday about 10 miles and he slipped off a rock and had an inversion injury. He was wearing Trail shoes and had a 30 pound pack on his back. Had fairly immediate pain but was able to bear weight. He was able to finish hiking for those 10 miles. The next day he still had some pain but did not really notice any swelling negative. Do not see any bruising. He had another 29 height that day. He said the last 3-4 miles were much more painful. Day after he returned from hiking he tried to go for run but was apparently unable to bear weight. Secondary to pain.  That was about 9 days ago. He has had fairly constant pain since then that has eased off a little bit since he has not been active. He has worn an ankle brace but states that hasn't helped very much because the pain is more on the top of his foot. Currently he denies any bruising or swelling. No actual pain in his ankles. He has no history of foot or ankle injury or weakness in the past.  ROS as above.    PMH reviewed. Patient is a nonsmoker.   Past Medical History:  Diagnosis Date  . ADHD (attention deficit hyperactivity disorder)    No past surgical history on file.  Medications reviewed. Current Outpatient Prescriptions  Medication Sig Dispense Refill  . azelastine (ASTELIN) 0.1 % nasal spray INSTILL 1 OR 2 SPAYS IEN BID  6  . lisdexamfetamine (VYVANSE) 20 MG capsule Take 1 capsule (20 mg total) by mouth daily. 30 capsule 0  . lisdexamfetamine (VYVANSE) 20 MG capsule Take 1 capsule (20 mg total) by mouth daily. 30 capsule 0  . lisdexamfetamine (VYVANSE) 20 MG capsule Take 1 capsule (20 mg total) by mouth daily. 30 capsule 0  . Albuterol Sulfate (PROAIR RESPICLICK) 123XX123 (90 BASE) MCG/ACT AEPB Inhale 2 puffs into the lungs every 4 (four) hours as needed (cough, wheeze,  short of breath.). (Patient not taking: Reported on 11/29/2015) 1 each 1  . fluticasone (FLONASE) 50 MCG/ACT nasal spray Place into both nostrils daily.     No current facility-administered medications for this visit.      Physical Exam:  BP (!) 106/50 (BP Location: Right Arm, Patient Position: Sitting, Cuff Size: Large)   Pulse 67   Temp 98.5 F (36.9 C) (Oral)   Resp 18   Ht 5' 8.25" (1.734 m)   Wt 211 lb 3.2 oz (95.8 kg)   SpO2 98%   BMI 31.88 kg/m  Gen:  Alert, cooperative patient who appears stated age in no acute distress.  Vital signs reviewed. MSK:  Left foot within normal limits. Right foot: No syndesmotic pain. No tenderness, swelling, bruising located at ankle including medial or lateral malleolus. Anterior drawer test is negative. No inversion or eversion pain at ankle. He does have some tenderness along dorsum of his foot third and fourth metatarsal. He does have pain here with resisted eversion of his foot as well. I do not feel any crepitus here. There is some minimal swelling when compared to left. No bruising noted.  Assessment and Plan:  1.  Right foot tendinitis: -X-rays are negative for fracture. -Pain does seem most consistent with tendinitis of ligaments the dorsum of his foot. -Ibuprofen  as analgesia and anti-inflammatory. -He has not been using any ice. Recommended he start doing this 20 mins on and 20 minutes off in the evening. -To use Ace bandages compression wrap his foot rather than his ankle. We did discuss how to wrap his foot with an Ace bandage. -Follow-up in 2-3 weeks for recheck. If worsening follow-up sooner. He would like to get back to being able to run a 5K in 30 minutes. Did discuss this may be a nagging injury for the next little bit.

## 2016-04-16 NOTE — Telephone Encounter (Signed)
Pt did not pick up script- Vyvanse 08/29/15 shredded

## 2016-05-19 IMAGING — CR DG SCAPULA*L*
2 series · 2 of 2 positions shown · non-contrast
Comparison: Chest x-ray of 08/02/2014

CLINICAL DATA: Left shoulder pain, no trauma

EXAM:
LEFT SCAPULA - 2+ VIEWS

[AP (1 of 2)]
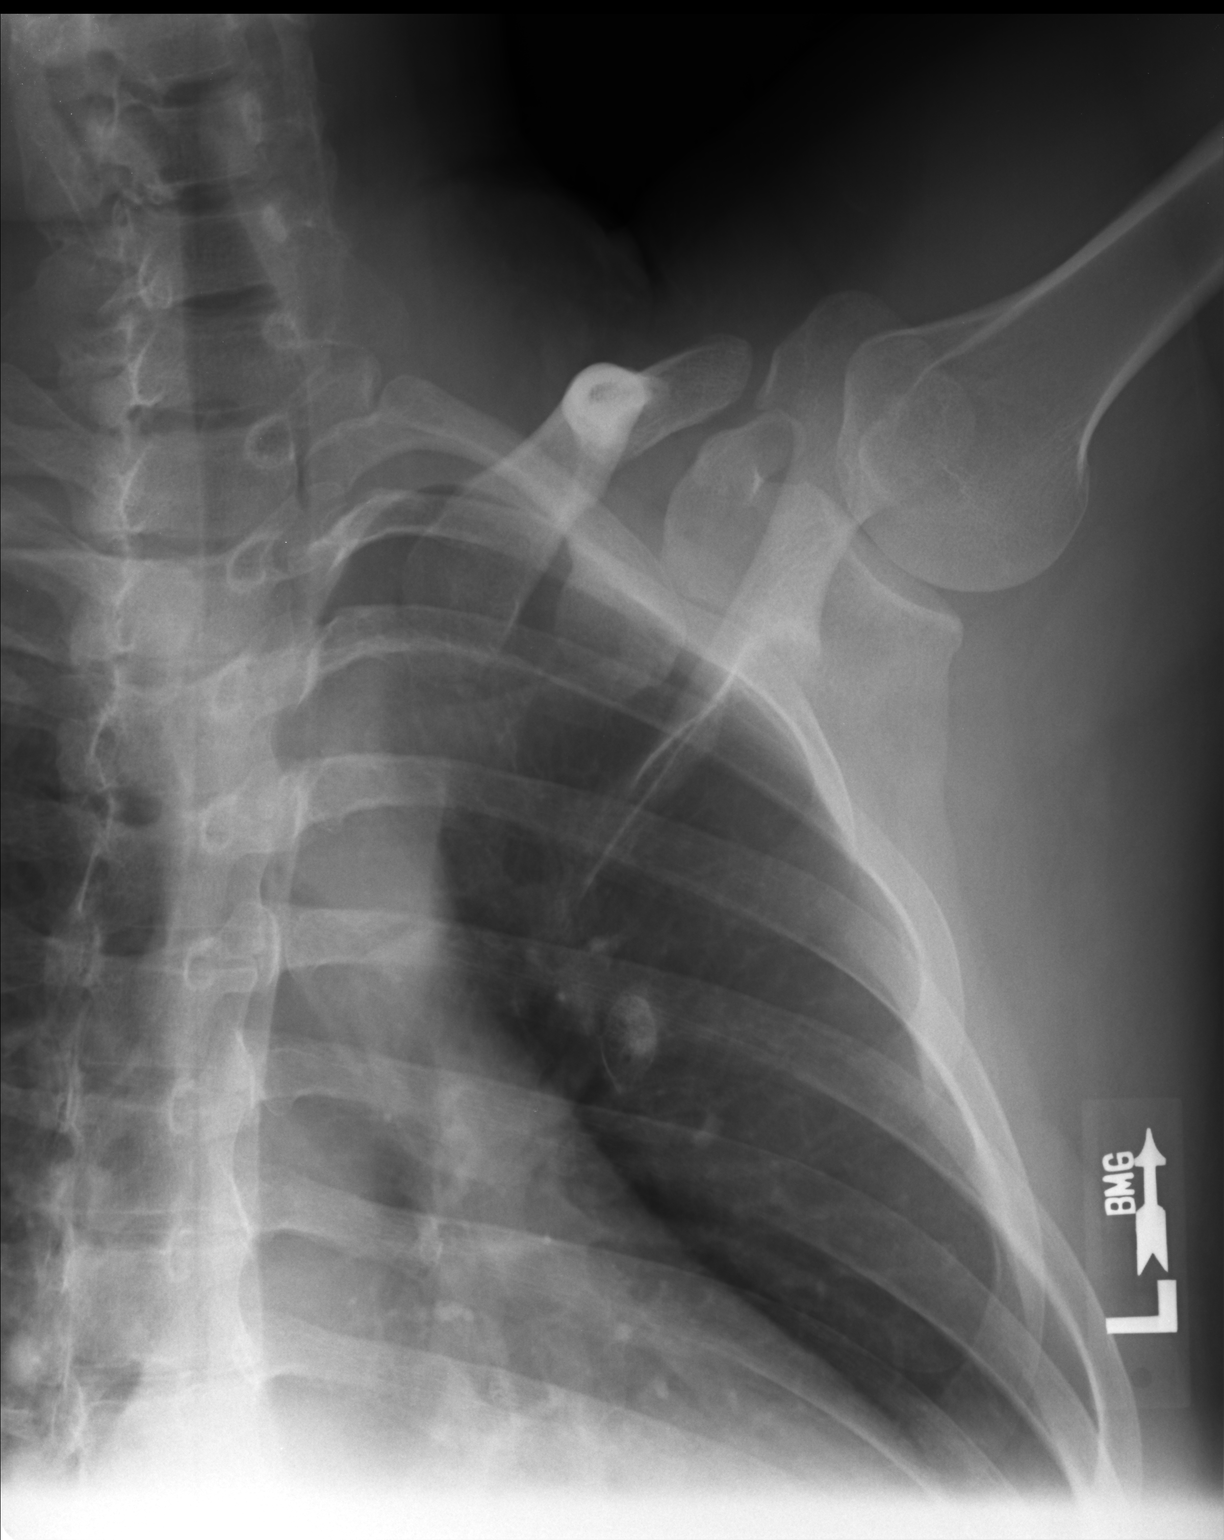

[AP (2 of 2)]
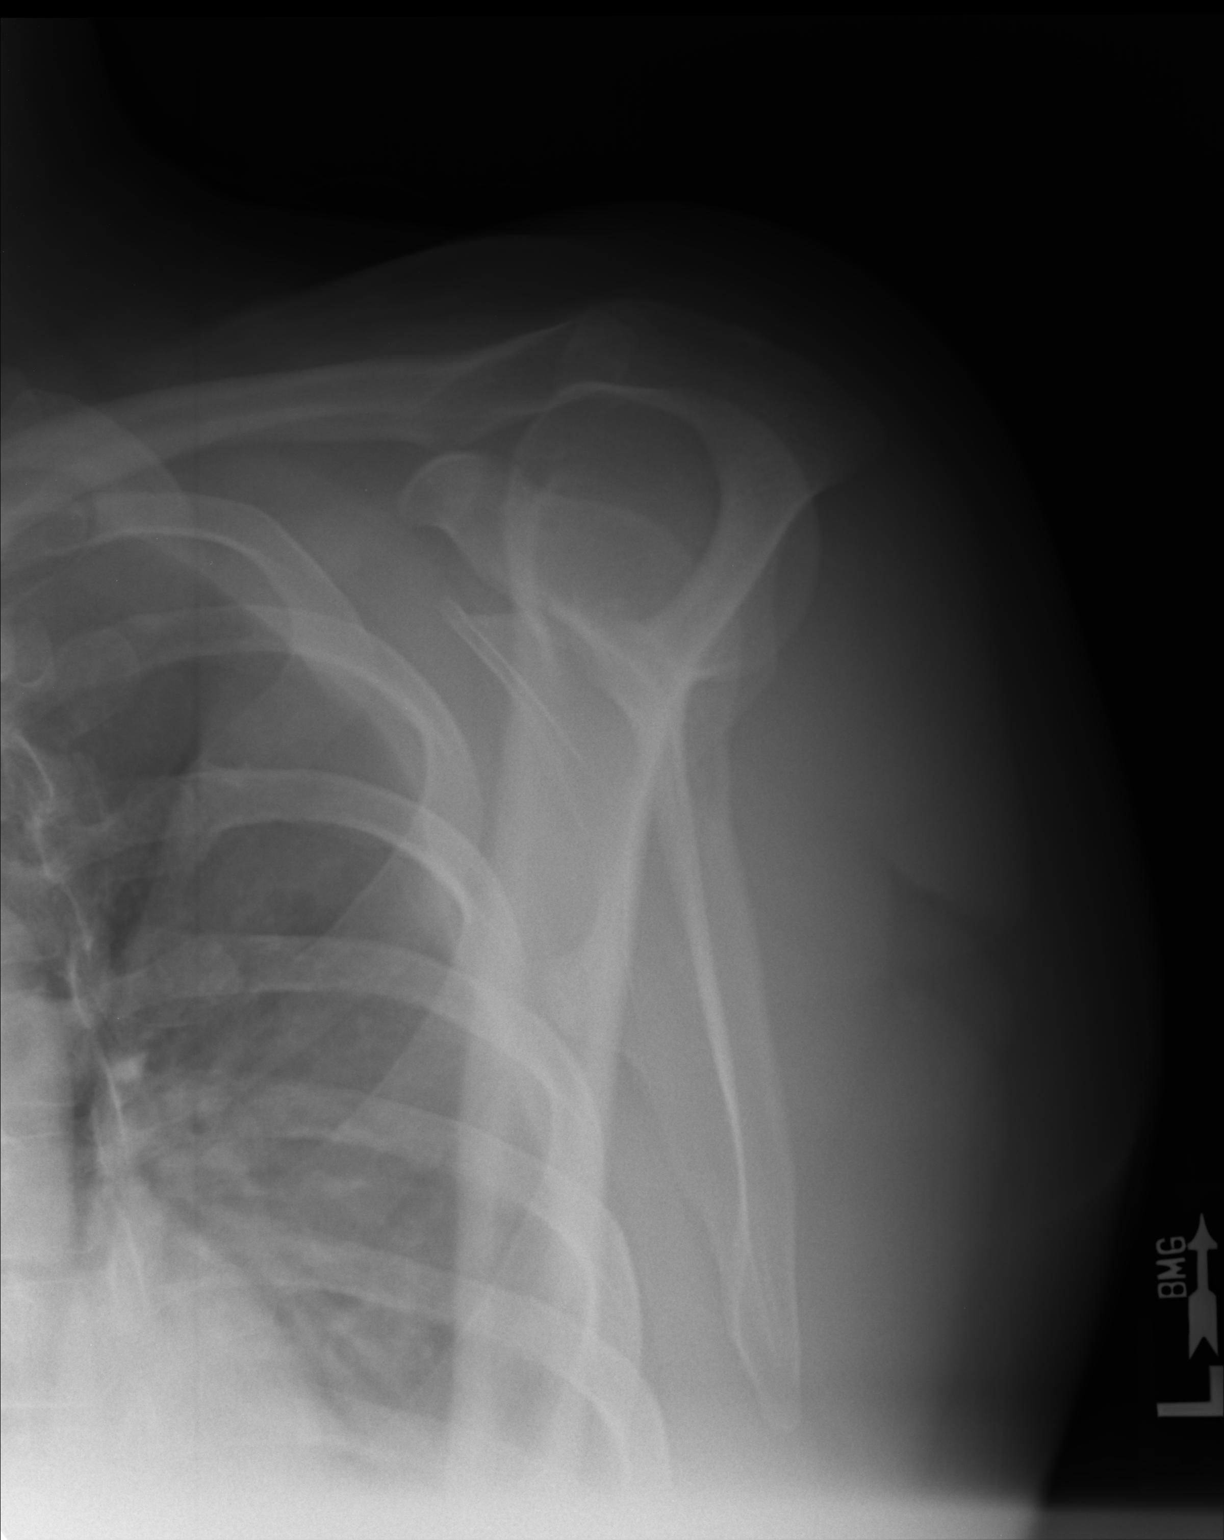

[2 of 2 positions shown; findings below may reference images not displayed]

FINDINGS: The left humeral head is in normal position and the left
glenohumeral joint space is unremarkable. The left AC joint is
normally aligned. No acute abnormality is seen. As noted on chest
x-ray, a bony density overlies the left upper lung which on the
oblique view probably emanates from the medial border of the scapula
and is most consistent with a benign exostosis.
IMPRESSION: 1. Negative left shoulder.
2. Probable bony exostosis from the medial border of the scapula. If
there is no pain present, this bony density is of doubtful
significance.

## 2017-01-08 ENCOUNTER — Other Ambulatory Visit: Payer: Self-pay

## 2017-01-08 ENCOUNTER — Ambulatory Visit
Admission: RE | Admit: 2017-01-08 | Discharge: 2017-01-08 | Disposition: A | Discharge status: Home / Self Care | Payer: BLUE CROSS/BLUE SHIELD | Source: Ambulatory Visit | Attending: Family Medicine | Admitting: Family Medicine

## 2017-01-08 ENCOUNTER — Encounter: Payer: Self-pay | Admitting: Family Medicine

## 2017-01-08 ENCOUNTER — Ambulatory Visit: Payer: BLUE CROSS/BLUE SHIELD | Admitting: Family Medicine

## 2017-01-08 VITALS — BP 110/78 | HR 74 | Temp 98.5°F | Ht 68.0 in | Wt 216.0 lb

## 2017-01-08 DIAGNOSIS — M25832 Other specified joint disorders, left wrist: Secondary | ICD-10-CM | POA: Insufficient documentation

## 2017-01-08 DIAGNOSIS — J069 Acute upper respiratory infection, unspecified: Secondary | ICD-10-CM | POA: Diagnosis not present

## 2017-01-08 DIAGNOSIS — Z23 Encounter for immunization: Secondary | ICD-10-CM | POA: Diagnosis not present

## 2017-01-08 DIAGNOSIS — R2232 Localized swelling, mass and lump, left upper limb: Secondary | ICD-10-CM | POA: Diagnosis not present

## 2017-01-08 NOTE — Patient Instructions (Addendum)
It was great to meet you today! Thank you for letting me participate in your care!  Today, we discussed the lump on your wrist. It most likely is a benign mass but we will get an x-ray for further evaluation.  If you have any questions please do not hesitate to call the office.  You are recovering from most likely an acute upper respiratory viral infection. Continue to take OTC medications for your symptoms. If you do not get better over a period of 14 total days since your symptoms began please return to the office for further management.  Be well, Harolyn Rutherford, DO PGY-1, [facility]

## 2017-01-08 NOTE — Progress Notes (Signed)
Subjective: No chief complaint on file.   HPI: Nathaniel Soto is a 31 y.o. presenting to clinic today to discuss the following:  1 Lump on his wrist 2 Establish care  Nathaniel Soto is a 31y/o male with PMH of Asthma, ADHD, Anxiety, who presents for evaluation of a lump on his wrist. He first noticed the lump in August of this year. He states it did hurt at first but does not hurt now. It measures approximately 1cm x 1cm. He rates the pain as 1-2/10 that was non-radiating that has since self-resolved.  He denies any associated systemic symptoms such as weight loss, fevers, chills, loss of appetite, or pain that wakes him up at night. He denies any acute trauma or injury that may have contributed to the formation of the lump. He states it has/has not been growing in size since he first noticed it.  He states for the past 7 days he has had nasal congestion, runny nose, a sore throat for 1-2 days with SOB. He believes it is due to a viral illness that has improved some with OTC mediations.  He also denies chest pain, he does endorse SOB, abdominal pain, nausea, vomiting, diarrhea, or constipation.  Health Maintenance: flu shot     ROS noted in HPI.   Past Medical, Surgical, Social, and Family History Reviewed & Updated per EMR.   Pertinent Historical Findings include:   Social History   Tobacco Use  Smoking Status Former Smoker  . Packs/day: 0.50  . Years: 1.00  . Pack years: 0.50  . Types: Cigarettes  . Last attempt to quit: 07/11/2009  . Years since quitting: 7.5      Objective: BP 110/78   Pulse 74   Temp 98.5 F (36.9 C) (Oral)   Ht 5\' 8"  (1.727 m)   Wt 216 lb (98 kg)   SpO2 97%   BMI 32.84 kg/m  Vitals and nursing notes reviewed  Physical Exam Gen: Alert and Oriented x 3, NAD HEENT: Normocephalic, atraumatic, PERRLA, EOMI, TM visible with good light reflex left, blunted light reflex on right TM due to fluid, TM intact bilaterally, non-swollen,  non-erythematous turbinates, non-erythematous pharyngeal mucosa, no exudates Neck: trachea midline, no thyroidmegaly, no LAD CV: RRR, no murmurs, normal S1, S2 split, +2 pulses radial bilaterally Resp: CTAB, no wheezing, rales, or rhonchi, comfortable work of breathing Abd: non-distended, non-tender, soft, +bs in all four quadrants, no hepatosplenomegaly MSK: FROM in all four extremities; left wrist has 1cm x 1cm hard immovable mass at the medial wrist joint over the carpal bones.  Ext: no clubbing, cyanosis, or edema Neuro: CN II-XII intact, no focal or gross deficits Skin: warm, dry, intact, no rashes Psych: appropriate behavior, mood, denies any depression at this time   No results found for this or any previous visit (from the past 72 hour(s)).  Assessment/Plan:  Mass of joint of left wrist Hard, immobile mass on the medial wrist joint located mainly over the carpal bones. Non painful with no concerning systemic symptoms. Noticed in August 2018. Does not feel like a ganglion cyst.  Getting an x-ray to rule out the unlikely event that it is a type of malignant growth.  Need for immunization against influenza Flu shot given  Acute upper respiratory infection Symptoms (see HPI) all consistent with acute viral illness that is improving with rest and OTC medications.   Informed patient that if he does not improve in another week to return to the office.  PATIENT EDUCATION PROVIDED: See AVS    Diagnosis and plan along with any newly prescribed medication(s) were discussed in detail with this patient today. The patient verbalized understanding and agreed with the plan. Patient advised if symptoms worsen return to clinic or ER.   Health Maintainance:   Orders Placed This Encounter  Procedures  . DG Wrist Complete Left    Standing Status:   Future    Standing Expiration Date:   01/08/2018    Order Specific Question:   Reason for Exam (SYMPTOM  OR DIAGNOSIS REQUIRED)    Answer:    boney mass on the left wrist on the medial side    Order Specific Question:   Preferred imaging location?    Answer:   [facility]    Order Specific Question:   Radiology Contrast Protocol - do NOT remove file path    Answer:   [email or web address]    No orders of the defined types were placed in this encounter.    Harolyn Rutherford, DO 01/08/2017, 1:41 PM PGY-1, [place]

## 2017-01-08 NOTE — Assessment & Plan Note (Signed)
Symptoms (see HPI) all consistent with acute viral illness that is improving with rest and OTC medications.   Informed patient that if he does not improve in another week to return to the office.

## 2017-01-08 NOTE — Assessment & Plan Note (Signed)
Flu shot given

## 2017-01-08 NOTE — Assessment & Plan Note (Signed)
Hard, immobile mass on the medial wrist joint located mainly over the carpal bones. Non painful with no concerning systemic symptoms. Noticed in August 2018. Does not feel like a ganglion cyst.  Getting an x-ray to rule out the unlikely event that it is a type of malignant growth.

## 2017-02-13 ENCOUNTER — Encounter: Payer: Self-pay | Admitting: Family Medicine

## 2017-02-13 NOTE — Progress Notes (Signed)
Left wrist x-ray showed a BB in the area of concern. Otherwise, it was normal. Mailed results to patient.

## 2017-04-06 ENCOUNTER — Encounter: Payer: Self-pay | Admitting: Internal Medicine

## 2017-04-06 ENCOUNTER — Ambulatory Visit (INDEPENDENT_AMBULATORY_CARE_PROVIDER_SITE_OTHER): Payer: Managed Care, Other (non HMO) | Admitting: Internal Medicine

## 2017-04-06 VITALS — BP 110/80 | HR 81 | Temp 98.5°F | Wt 214.8 lb

## 2017-04-06 DIAGNOSIS — J069 Acute upper respiratory infection, unspecified: Secondary | ICD-10-CM | POA: Diagnosis not present

## 2017-04-06 NOTE — Progress Notes (Signed)
   [facility] Kerrin Mo, MD Phone: [phone]  Reason For Visit: SDA for cough and congestion  # URI  Has been sick for since Saturday with cough and congestion.  Denies any fevers.  Denies any chills.  Denies any muscle aches.  Denies any sore throat.  He denies any shortness of breath.  States he is taking some NyQuil to help him sleep.  Has had some decreased appetite.  He is coming in for this appointment as he needs a letter for his work since he has been out for a couple of days.  Does not feel that his symptoms at this point warrant any further medication  Past Medical History Reviewed problem list.  Medications- reviewed and updated No additions to family history Social history- patient is a non- smoker  Objective: BP 110/80 (BP Location: Right Arm, Patient Position: Sitting, Cuff Size: Normal)   Pulse 81   Temp 98.5 F (36.9 C) (Oral)   Wt 214 lb 12.8 oz (97.4 kg)   SpO2 97%   BMI 32.66 kg/m  Gen: NAD, alert, cooperative with exam HEENT: Normal    Neck: No masses palpated. Spotty lymphadenopathy    Ears: Tympanic membranes intact, normal light reflex, no erythema, no bulging    Nose: nasal turbinates congested    Throat: moist mucus membranes, no eryema Cardio: regular rate and rhythm, S1S2 heard, no murmurs appreciated Pulm: clear to auscultation bilaterally, no wheezes, rhonchi or rales Skin: dry, intact, no rashes or lesions  Assessment/Plan: See problem based a/p  Upper respiratory virus Mild URI.  States that he does not need any further medications.  Would like to return to work tomorrow.  However would like an excuse today.

## 2017-04-06 NOTE — Patient Instructions (Signed)
Please follow up as needed 

## 2017-04-07 ENCOUNTER — Encounter: Payer: Self-pay | Admitting: Internal Medicine

## 2017-04-07 DIAGNOSIS — J069 Acute upper respiratory infection, unspecified: Secondary | ICD-10-CM | POA: Insufficient documentation

## 2017-04-07 NOTE — Assessment & Plan Note (Signed)
Mild URI.  States that he does not need any further medications.  Would like to return to work tomorrow.  However would like an excuse today.

## 2017-08-18 ENCOUNTER — Telehealth: Payer: Self-pay | Admitting: *Deleted

## 2017-08-18 ENCOUNTER — Other Ambulatory Visit: Payer: Self-pay

## 2017-08-18 ENCOUNTER — Ambulatory Visit (HOSPITAL_COMMUNITY)
Admission: EM | Admit: 2017-08-18 | Discharge: 2017-08-18 | Disposition: A | Discharge status: Home / Self Care | Payer: Managed Care, Other (non HMO) | Attending: Family Medicine | Admitting: Family Medicine

## 2017-08-18 ENCOUNTER — Encounter (HOSPITAL_COMMUNITY): Payer: Self-pay | Admitting: *Deleted

## 2017-08-18 DIAGNOSIS — Z87891 Personal history of nicotine dependence: Secondary | ICD-10-CM | POA: Diagnosis not present

## 2017-08-18 DIAGNOSIS — Z79899 Other long term (current) drug therapy: Secondary | ICD-10-CM | POA: Diagnosis not present

## 2017-08-18 DIAGNOSIS — J029 Acute pharyngitis, unspecified: Secondary | ICD-10-CM | POA: Diagnosis present

## 2017-08-18 DIAGNOSIS — R05 Cough: Secondary | ICD-10-CM | POA: Insufficient documentation

## 2017-08-18 DIAGNOSIS — F909 Attention-deficit hyperactivity disorder, unspecified type: Secondary | ICD-10-CM | POA: Diagnosis not present

## 2017-08-18 DIAGNOSIS — F419 Anxiety disorder, unspecified: Secondary | ICD-10-CM | POA: Diagnosis not present

## 2017-08-18 DIAGNOSIS — J069 Acute upper respiratory infection, unspecified: Secondary | ICD-10-CM | POA: Diagnosis not present

## 2017-08-18 DIAGNOSIS — B9789 Other viral agents as the cause of diseases classified elsewhere: Secondary | ICD-10-CM | POA: Diagnosis not present

## 2017-08-18 DIAGNOSIS — R0989 Other specified symptoms and signs involving the circulatory and respiratory systems: Secondary | ICD-10-CM | POA: Diagnosis present

## 2017-08-18 LAB — POCT RAPID STREP A: STREPTOCOCCUS, GROUP A SCREEN (DIRECT): NEGATIVE

## 2017-08-18 MED ORDER — BENZONATATE 200 MG PO CAPS: 200.0000 mg | ORAL_CAPSULE | Freq: Three times a day (TID) | ORAL | 0 refills | Status: AC | PRN

## 2017-08-18 MED ORDER — CETIRIZINE HCL 10 MG PO CAPS: 10.0000 mg | ORAL_CAPSULE | Freq: Every day | ORAL | 0 refills | Status: DC

## 2017-08-18 MED ORDER — FLUTICASONE PROPIONATE 50 MCG/ACT NA SUSP: 1.0000 | Freq: Every day | NASAL | 0 refills | Status: DC

## 2017-08-18 MED ORDER — AMOXICILLIN-POT CLAVULANATE 875-125 MG PO TABS: 1.0000 | ORAL_TABLET | Freq: Two times a day (BID) | ORAL | 0 refills | Status: AC

## 2017-08-18 NOTE — ED Provider Notes (Signed)
[place]    CSN: [id number] Arrival date & time: 08/18/17  1254     History   Chief Complaint Chief Complaint  Patient presents with  . Sore Throat    HPI Nathaniel Soto is a 32 y.o. male history of ADHD, Patient is presenting with URI symptoms- congestion, cough, sore throat.  Also endorsing body aches.  Patient's main complaints are sore throat, concern for strep. Symptoms have been going on for 5-6 days. Patient has tried Mucinex DM, ibuprofen, with minimal relief. Denies fever, nausea, vomiting, diarrhea. Denies shortness of breath and chest pain.   HPI  Past Medical History:  Diagnosis Date  . ADHD (attention deficit hyperactivity disorder)     Patient Active Problem List   Diagnosis Date Noted  . Upper respiratory virus 04/07/2017  . Mass of joint of left wrist 01/08/2017  . Need for immunization against influenza 01/08/2017  . Acute upper respiratory infection 01/08/2017  . Asthma with acute exacerbation 08/02/2014  . Bony exostosis 08/02/2014  . Attention deficit hyperactivity disorder (ADHD) 04/02/2012  . Anxiety 08/16/2010    History reviewed. No pertinent surgical history.     Home Medications    Prior to Admission medications   Medication Sig Start Date End Date Taking? Authorizing Provider  Albuterol Sulfate (PROAIR RESPICLICK) 322 (90 BASE) MCG/ACT AEPB Inhale 2 puffs into the lungs every 4 (four) hours as needed (cough, wheeze, short of breath.). Patient not taking: Reported on 11/29/2015 06/30/14   Shawnee Knapp, MD  amoxicillin-clavulanate (AUGMENTIN) 875-125 MG tablet Take 1 tablet by mouth every 12 (twelve) hours for 10 days. 08/18/17 08/28/17  Shyna Duignan C, PA-C  azelastine (ASTELIN) 0.1 % nasal spray INSTILL 1 OR 2 SPAYS IEN BID 10/11/14   [provider]  benzonatate (TESSALON) 200 MG capsule Take 1 capsule (200 mg total) by mouth 3 (three) times daily as needed for up to 7 days for cough. 08/18/17 08/25/17  Izzak Fries C,  PA-C  Cetirizine HCl 10 MG CAPS Take 1 capsule (10 mg total) by mouth daily for 10 days. 08/18/17 08/28/17  Taffie Eckmann C, PA-C  fluticasone (FLONASE) 50 MCG/ACT nasal spray Place 1-2 sprays into both nostrils daily for 7 days. 08/18/17 08/25/17  Vicy Medico C, PA-C  ibuprofen (ADVIL,MOTRIN) 800 MG tablet Take 1 tablet (800 mg total) by mouth every 8 (eight) hours as needed. 11/29/15   Alveda Reasons, MD  lisdexamfetamine (VYVANSE) 20 MG capsule Take 1 capsule (20 mg total) by mouth daily. 08/30/15   Weber, Damaris Hippo, PA-C  lisdexamfetamine (VYVANSE) 20 MG capsule Take 1 capsule (20 mg total) by mouth daily. 08/30/15   Weber, Damaris Hippo, PA-C  lisdexamfetamine (VYVANSE) 20 MG capsule Take 1 capsule (20 mg total) by mouth daily. 08/30/15   Gale Journey, Damaris Hippo, PA-C    Family History Family History  Problem Relation Age of Onset  . Diabetes Paternal Grandmother   . Alcoholism Mother   . Heart attack Father     Social History Social History   Tobacco Use  . Smoking status: Former Smoker    Packs/day: 0.50    Years: 1.00    Pack years: 0.50    Types: Cigarettes    Last attempt to quit: 07/11/2009    Years since quitting: 8.1  . Smokeless tobacco: Never Used  Substance Use Topics  . Alcohol use: Yes    Alcohol/week: 1.2 oz    Types: 2 Cans of beer per week  . Drug use: No  Allergies   Patient has no known allergies.   Review of Systems Review of Systems  Constitutional: Negative for activity change, appetite change, chills, fatigue and fever.  HENT: Positive for congestion, rhinorrhea, sinus pressure and sore throat. Negative for ear pain and trouble swallowing.   Eyes: Negative for discharge and redness.  Respiratory: Positive for cough. Negative for chest tightness and shortness of breath.   Cardiovascular: Negative for chest pain.  Gastrointestinal: Negative for abdominal pain, diarrhea, nausea and vomiting.  Musculoskeletal: Positive for myalgias.  Skin: Negative for rash.    Neurological: Negative for dizziness, light-headedness and headaches.     Physical Exam Triage Vital Signs ED Triage Vitals  Enc Vitals Group     BP 08/18/17 1310 109/69     Pulse Rate 08/18/17 1310 62     Resp 08/18/17 1310 18     Temp 08/18/17 1310 98.5 F (36.9 C)     Temp Source 08/18/17 1310 Oral     SpO2 08/18/17 1310 97 %     Weight --      Height --      Head Circumference --      Peak Flow --      Pain Score 08/18/17 1311 5     Pain Loc --      Pain Edu? --      Excl. in [place]? --    No data found.  Updated Vital Signs BP 109/69   Pulse 62   Temp 98.5 F (36.9 C) (Oral)   Resp 18   SpO2 97%   Visual Acuity Right Eye Distance:   Left Eye Distance:   Bilateral Distance:    Right Eye Near:   Left Eye Near:    Bilateral Near:     Physical Exam  Constitutional: He appears well-developed and well-nourished.  HENT:  Head: Normocephalic and atraumatic.  Bilateral ears without tenderness to palpation of external auricle, tragus and mastoid, EAC's without erythema or swelling, TM's with good bony landmarks and cone of light. Non erythematous.  Oral mucosa pink and moist, no tonsillar enlargement or exudate. Posterior pharynx patent and erythematous, no uvula deviation or swelling. Normal phonation.   Eyes: Conjunctivae are normal.  Neck: Neck supple.  Cardiovascular: Normal rate and regular rhythm.  No murmur heard. Pulmonary/Chest: Effort normal and breath sounds normal. No respiratory distress.  Breathing comfortably at rest, CTABL, no wheezing, rales or other adventitious sounds auscultated  Abdominal: Soft. There is no tenderness.  Musculoskeletal: He exhibits no edema.  Neurological: He is alert.  Skin: Skin is warm and dry.  Psychiatric: He has a normal mood and affect.  Nursing note and vitals reviewed.    UC Treatments / Results  Labs (all labs ordered are listed, but only abnormal results are displayed) Labs Reviewed  CULTURE, GROUP A STREP  [facility])  POCT RAPID STREP A    EKG None  Radiology No results found.  Procedures Procedures (including critical care time)  Medications Ordered in UC Medications - No data to display  Initial Impression / Assessment and Plan / UC Course  I have reviewed the triage vital signs and the nursing notes.  Pertinent labs & imaging results that were available during my care of the patient were reviewed by me and considered in my medical decision making (see chart for details).     Patient tested negative for strep. No evidence of peritonsillar abscess or retropharyngeal abscess. Patient is nontoxic appearing, no drooling, dysphagia, muffled voice, or tripoding.  No trismus.  URI symptoms likely viral etiology, will recommend to continue symptomatic management.  Recommend adding daily antihistamine, Tessalon for cough.  Provided prescription for Augmentin to fill on 7/12 if symptoms continuing to persist without improvement with recommendations of above.Discussed strict return precautions. Patient verbalized understanding and is agreeable with plan.    Final Clinical Impressions(s) / UC Diagnoses   Final diagnoses:  Viral URI with cough     Discharge Instructions     Sore Throat  Your rapid strep tested Negative today. We will send for a culture and call in about 2 days if results are positive. For now we will treat your sore throat as a virus with symptom management.   Please continue Tylenol or Ibuprofen for fever and pain. May try salt water gargles, cepacol lozenges, throat spray, or OTC cold relief medicine for throat discomfort. If you also have congestion take a daily anti-histamine like Zyrtec, Claritin, and a oral decongestant to help with post nasal drip that may be irritating your throat.   Stay hydrated and drink plenty of fluids to keep your throat coated relieve irritation.    ED Prescriptions    Medication Sig Dispense Auth. Provider   Cetirizine HCl 10 MG CAPS Take  1 capsule (10 mg total) by mouth daily for 10 days. 10 capsule Emiya Loomer C, PA-C   benzonatate (TESSALON) 200 MG capsule Take 1 capsule (200 mg total) by mouth 3 (three) times daily as needed for up to 7 days for cough. 28 capsule Cecille Mcclusky C, PA-C   fluticasone (FLONASE) 50 MCG/ACT nasal spray Place 1-2 sprays into both nostrils daily for 7 days. 1 g Brylea Pita C, PA-C   amoxicillin-clavulanate (AUGMENTIN) 875-125 MG tablet Take 1 tablet by mouth every 12 (twelve) hours for 10 days. 20 tablet Mao Lockner, [place] C, PA-C     Controlled Substance Prescriptions [place] Controlled Substance Registry consulted? Not Applicable   Janith Lima, [place] 08/18/17 1431

## 2017-08-18 NOTE — Telephone Encounter (Signed)
Pt states that he has had cold symptoms since Thursday and would like to be seen today.   He has to work this AM, but will get off @ 12.  No openings so I offered appt tomorrow or and overflow this afternoon.  Pt declined both and chooses to go to urgent care. [place], Salome Spotted, CMA

## 2017-08-18 NOTE — ED Triage Notes (Signed)
C/o sore throat and generalized bodyaches onset Thurs.

## 2017-08-18 NOTE — Discharge Instructions (Addendum)

## 2017-08-20 LAB — CULTURE, GROUP A STREP (THRC)

## 2017-09-17 ENCOUNTER — Ambulatory Visit (INDEPENDENT_AMBULATORY_CARE_PROVIDER_SITE_OTHER): Payer: Managed Care, Other (non HMO) | Admitting: Family Medicine

## 2017-09-17 VITALS — BP 110/80 | HR 74 | Temp 98.3°F | Ht 68.0 in | Wt 190.8 lb

## 2017-09-17 DIAGNOSIS — F902 Attention-deficit hyperactivity disorder, combined type: Secondary | ICD-10-CM | POA: Diagnosis not present

## 2017-09-17 MED ORDER — AMPHETAMINE-DEXTROAMPHET ER 10 MG PO CP24: 10.0000 mg | ORAL_CAPSULE | Freq: Every day | ORAL | 0 refills | Status: DC

## 2017-09-17 NOTE — Patient Instructions (Signed)
It was great to see you today! Thank you for letting me participate in your care!  Today, we discussed your ADHD and I am switching you from Vyvanse to Adderall as it is more affordable on your insurance plan. If you have any issues/side effects please discontinue its use and contact me immediately. If you feel like Adderall is not controlling your symptoms please call me and we can adjust the dose.  Please follow up with me in one month to ensure the medication is not having adverse effects.  Be well, Harolyn Rutherford, DO PGY-2, [facility]

## 2017-09-17 NOTE — Progress Notes (Signed)
     Subjective: No chief complaint on file.  HPI: Nathaniel Soto is a 32 y.o. presenting to clinic today to discuss the following:  ADHD Patient presents for management for his ADHD. 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 He states he is beginning to have difficulties in concentration and finishing tasks associated with the application process. 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 He has been diagnosed with ADHD since 2010.  He denies headaches, vision changes, over activity, tremors, difficulty sleeping, palpitations, nausea, vomiting, or diarrhea.  Health Maintenance: None     ROS noted in HPI.   Past Medical, Surgical, Social, and Family History Reviewed & Updated per EMR.   Pertinent Historical Findings include:   Social History   Tobacco Use  Smoking Status Former Smoker  . Packs/day: 0.50  . Years: 1.00  . Pack years: 0.50  . Types: Cigarettes  . Last attempt to quit: 07/11/2009  . Years since quitting: 8.1  Smokeless Tobacco Never Used    Objective: BP 110/80   Pulse 74   Temp 98.3 F (36.8 C) (Oral)   Ht 5\' 8"  (1.727 m)   Wt 190 lb 12.8 oz (86.5 kg)   SpO2 100%   BMI 29.01 kg/m  Vitals and nursing notes reviewed  Physical Exam Gen: Alert and Oriented x 3, NAD HEENT: Normocephalic, atraumatic, PERRLA, EOMI CV: RRR, no murmurs, normal S1, S2 split, +2 pulses dorsalis pedis bilaterally Resp: CTAB, no wheezing, rales, or rhonchi, comfortable work of breathing Skin: warm, dry, intact, no rashes Psych: appropriate behavior, mood  No results found for this or any previous visit (from the past 72 hour(s)).  Assessment/Plan:  Attention deficit hyperactivity disorder (ADHD) Starting patient on Adderall ER 10mg  daily. He was on  Vyvanse 20mg  daily and his symptoms were well controlled however due to a new insurance plan Vyvanse is now expensive. Follow up in one month to ensure he is not having any negative side effects from Adderall and that it is controlling his symptoms. Can titrate up if needed.   PATIENT EDUCATION PROVIDED: See AVS    Diagnosis and plan along with any newly prescribed medication(s) were discussed in detail with this patient today. The patient verbalized understanding and agreed with the plan. Patient advised if symptoms worsen return to clinic or ER.   Health Maintainance:   No orders of the defined types were placed in this encounter.   Meds ordered this encounter  Medications  . amphetamine-dextroamphetamine (ADDERALL XR) 10 MG 24 hr capsule    Sig: Take 1 capsule (10 mg total) by mouth daily.    Dispense:  30 capsule    Refill:  0     Harolyn Rutherford, DO 09/22/2017, 3:06 PM PGY-2 [place]

## 2017-09-22 NOTE — Assessment & Plan Note (Signed)
Starting patient on Adderall ER 10mg  daily. He was on Vyvanse 20mg  daily and his symptoms were well controlled however due to a new insurance plan Vyvanse is now expensive. Follow up in one month to ensure he is not having any negative side effects from Adderall and that it is controlling his symptoms. Can titrate up if needed.

## 2017-10-21 ENCOUNTER — Other Ambulatory Visit: Payer: Self-pay

## 2017-10-21 NOTE — Telephone Encounter (Signed)
Pt called nurse line requesting a refill on his Adderall. Please advise.

## 2017-10-22 MED ORDER — AMPHETAMINE-DEXTROAMPHET ER 10 MG PO CP24: 10.0000 mg | ORAL_CAPSULE | Freq: Every day | ORAL | 0 refills | Status: DC

## 2017-10-25 ENCOUNTER — Other Ambulatory Visit: Payer: Self-pay | Admitting: Family Medicine

## 2017-10-26 MED ORDER — AMPHETAMINE-DEXTROAMPHET ER 10 MG PO CP24: 10.0000 mg | ORAL_CAPSULE | Freq: Every day | ORAL | 0 refills | Status: DC

## 2017-10-30 ENCOUNTER — Ambulatory Visit: Payer: Managed Care, Other (non HMO) | Admitting: Family Medicine

## 2017-11-06 ENCOUNTER — Encounter: Payer: Self-pay | Admitting: Family Medicine

## 2017-11-06 ENCOUNTER — Other Ambulatory Visit: Payer: Self-pay

## 2017-11-06 ENCOUNTER — Ambulatory Visit (INDEPENDENT_AMBULATORY_CARE_PROVIDER_SITE_OTHER): Payer: Managed Care, Other (non HMO) | Admitting: Family Medicine

## 2017-11-06 VITALS — BP 128/78 | HR 64 | Temp 97.8°F | Wt 181.8 lb

## 2017-11-06 DIAGNOSIS — F902 Attention-deficit hyperactivity disorder, combined type: Secondary | ICD-10-CM | POA: Diagnosis not present

## 2017-11-06 DIAGNOSIS — L989 Disorder of the skin and subcutaneous tissue, unspecified: Secondary | ICD-10-CM | POA: Diagnosis not present

## 2017-11-06 DIAGNOSIS — Z23 Encounter for immunization: Secondary | ICD-10-CM

## 2017-11-06 MED ORDER — AMPHETAMINE-DEXTROAMPHET ER 10 MG PO CP24: 10.0000 mg | ORAL_CAPSULE | Freq: Every day | ORAL | 0 refills | Status: DC

## 2017-11-06 NOTE — Patient Instructions (Signed)
It was great to see you today! Thank you for letting me participate in your care!  Today, we discussed your current medications for ADHD and we decided not to make any changes today. If you begin to have worsening symptoms of ADHD and feel like your symptoms are poorly controlled please call the office and I will see you to consider increasing the medication or a possible medication change.   Be well, Harolyn Rutherford, DO PGY-2, [facility]

## 2017-11-06 NOTE — Progress Notes (Signed)
Subjective: Chief Complaint  Patient presents with  . ADHD    HPI: Nathaniel Soto is a 32 y.o. 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 He is doing well with on concerns and had had no side effects since starting Adderall. Patient states his symptoms are well controlled on the current dose.  Skin Lesion Patient requested I look at a skin lesion located midline on his chest. Patient endorses he had a mole there and he tended to pick at it. Not painful, not changing, not growing.  Health Maintenance: influenza vaccine     ROS noted in HPI.   Past Medical, Surgical, Social, and Family History Reviewed & Updated per EMR.   Pertinent Historical Findings include:   Social History   Tobacco Use  Smoking Status Former Smoker  . Packs/day: 0.50  . Years: 1.00  . Pack years: 0.50  . Types: Cigarettes  . Last attempt to quit: 07/11/2009  . Years since quitting: 8.3  Smokeless Tobacco Never Used    Objective: BP 128/78 (BP Location: Right Arm)   Pulse 64   Temp 97.8 F (36.6 C) (Oral)   Wt 181 lb 12.8 oz (82.5 kg)   SpO2 98%   BMI 27.64 kg/m  Vitals and nursing notes reviewed  Physical Exam Gen: Alert and Oriented x 3, NAD CV: RRR, no murmurs, normal S1, S2 split Resp: CTAB, no wheezing, rales, or rhonchi, comfortable work of breathing MSK: Moves all four extremities Ext: no clubbing, cyanosis, or edema Skin: warm, dry, intact, no rashes, small oval shaped mole located in the midline of the chest that appears to have been scraped. Regular borders, reddish-pink in color, and approximately >1cm in diameter.  No results found for this or any previous visit (from the past 72 hour(s)).  Assessment/Plan:  Attention deficit hyperactivity disorder (ADHD) Continue Adderall XR 10mg  daily and as needed to control symptoms. Prescriptions given  for the next 5 months.  Skin lesion Skin lesion is a mole with regular borders, uniform in color, and not growing in size. No need to continue to monitor unless patient reports a change.  Cont routine recommended skin care to limit skin damage via sun exposure.   PATIENT EDUCATION PROVIDED: See AVS    Diagnosis and plan along with any newly prescribed medication(s) were discussed in detail with this patient today. The patient verbalized understanding and agreed with the plan. Patient advised if symptoms worsen return to clinic or ER.   Health Maintainance:   Orders Placed This Encounter  Procedures  . Flu Vaccine QUAD 36+ mos IM    Meds ordered this encounter  Medications  . amphetamine-dextroamphetamine (ADDERALL XR) 10 MG 24 hr capsule    Sig: Take 1 capsule (10 mg total) by mouth daily.    Dispense:  30 capsule    Refill:  0  . amphetamine-dextroamphetamine (ADDERALL XR) 10 MG 24 hr capsule    Sig: Take 1 capsule (10 mg total) by mouth daily. Fill after 30 days    Dispense:  30 capsule    Refill:  0  . amphetamine-dextroamphetamine (ADDERALL XR) 10 MG 24 hr capsule    Sig: Take 1 capsule (10 mg total) by mouth daily. Fill after 60 days after print date    Dispense:  30 capsule    Refill:  0  . amphetamine-dextroamphetamine (ADDERALL XR) 10 MG 24  hr capsule    Sig: Take 1 capsule (10 mg total) by mouth daily. Fill after 90 days of print date    Dispense:  30 capsule    Refill:  0  . amphetamine-dextroamphetamine (ADDERALL XR) 10 MG 24 hr capsule    Sig: Take 1 capsule (10 mg total) by mouth daily. Fill after 120 days of print date    Dispense:  30 capsule    Refill:  0     Tim Garlan Fillers, DO 11/06/2017, 8:30 AM PGY-2 [place]

## 2017-11-06 NOTE — Assessment & Plan Note (Signed)
Continue Adderall XR 10mg  daily and as needed to control symptoms. Prescriptions given for the next 5 months.

## 2017-11-06 NOTE — Assessment & Plan Note (Signed)
Skin lesion is a mole with regular borders, uniform in color, and not growing in size. No need to continue to monitor unless patient reports a change.  Cont routine recommended skin care to limit skin damage via sun exposure.

## 2018-03-19 DIAGNOSIS — F4321 Adjustment disorder with depressed mood: Secondary | ICD-10-CM | POA: Diagnosis not present

## 2018-04-01 DIAGNOSIS — F4321 Adjustment disorder with depressed mood: Secondary | ICD-10-CM | POA: Diagnosis not present

## 2018-04-08 DIAGNOSIS — F4321 Adjustment disorder with depressed mood: Secondary | ICD-10-CM | POA: Diagnosis not present

## 2018-04-22 DIAGNOSIS — F4321 Adjustment disorder with depressed mood: Secondary | ICD-10-CM | POA: Diagnosis not present

## 2018-04-29 DIAGNOSIS — F4321 Adjustment disorder with depressed mood: Secondary | ICD-10-CM | POA: Diagnosis not present

## 2018-05-03 ENCOUNTER — Telehealth: Payer: Self-pay | Admitting: Family Medicine

## 2018-05-03 NOTE — Telephone Encounter (Signed)
Pt is calling to schedule an appointment to f/u about his Adderall and refills within the next month.   I explained to him that we are doing a lot of those appointments over the phone. He said he understood and he would like for Dr. Garlan Fillers to call him to see if they will be able to have this visit over the phone or if he would like him to come in the office.   Please call pt to discuss.

## 2018-05-04 NOTE — Telephone Encounter (Signed)
Pt scheduled to talk to you Tuesday. Almir Botts Kennon Holter, CMA

## 2018-05-05 DIAGNOSIS — F4321 Adjustment disorder with depressed mood: Secondary | ICD-10-CM | POA: Diagnosis not present

## 2018-05-11 ENCOUNTER — Telehealth (INDEPENDENT_AMBULATORY_CARE_PROVIDER_SITE_OTHER): Payer: Self-pay | Admitting: Family Medicine

## 2018-05-11 ENCOUNTER — Telehealth: Payer: Self-pay | Admitting: Family Medicine

## 2018-05-11 ENCOUNTER — Other Ambulatory Visit: Payer: Self-pay

## 2018-05-11 DIAGNOSIS — F902 Attention-deficit hyperactivity disorder, combined type: Secondary | ICD-10-CM

## 2018-05-11 MED ORDER — AMPHETAMINE-DEXTROAMPHETAMINE 15 MG PO TABS: 15.0000 mg | ORAL_TABLET | Freq: Every day | ORAL | 0 refills | Status: DC

## 2018-05-11 NOTE — Telephone Encounter (Signed)
No answer.  Left VM to call the office back if would still like to have a visit.

## 2018-05-11 NOTE — Assessment & Plan Note (Signed)
Will increase patient's Adderall from 10 mg to 15 mg daily.  Patient advised that this and agrees to this plan.  Sent in 1 prescription for this.  Patient states that he will continue to take 10 mg for the next month, and will then switch to the 15 mg.  Advised him to have a follow-up appointment in 2 months after switching to the higher dose of Adderall.  We will not schedule at this time as unclear at this time if we will still be doing telemedicine or if patient can have an in person encounter. -Increase Adderall from 10 mg to 15 mg daily -Follow-up in 2 months, patient to call for appointment

## 2018-05-11 NOTE — Progress Notes (Signed)
[facility] Telemedicine Visit  Patient consented to have visit conducted via telephone.  Encounter participants: Patient: Nathaniel Soto  Provider: Bernita Raisin Fransheska Willingham  Others (if applicable): None  Chief Complaint: ADHD  HPI: She had a telemedicine appointment scheduled for today for Adderall refill.  He has been on this medication for many years.  He is currently taking online classes.  He states that he has not been able to concentrate as much as he thinks he should.  He states that he is having a hard time doing these online classes because of this.  He is asking that his medication be increased.  He has not had any side effects from this medication recently.  He states that about 7 to 8 years ago when he was first started on Adderall 20 mg, he had dry mouth, but has not had that since.  He states that in the last few days he just refilled his last prescription for Adderall.  ROS: No recent infectious symptoms  Pertinent PMHx: ADHD, anxiety, asthma  Exam:  Respiratory: Patient speaking in complete sentences and does not appear to be in any acute respiratory distress over the phone.  Assessment/Plan:  Attention deficit hyperactivity disorder (ADHD) Will increase patient's Adderall from 10 mg to 15 mg daily.  Patient advised that this and agrees to this plan.  Sent in 1 prescription for this.  Patient states that he will continue to take 10 mg for the next month, and will then switch to the 15 mg.  Advised him to have a follow-up appointment in 2 months after switching to the higher dose of Adderall.  We will not schedule at this time as unclear at this time if we will still be doing telemedicine or if patient can have an in person encounter. -Increase Adderall from 10 mg to 15 mg daily -Follow-up in 2 months, patient to call for appointment   Rx sent in error that did not state to fill 30 days after script date.  Called and clarified with pharmacy.  Time  spent on phone with patient: 8 minutes

## 2018-05-13 DIAGNOSIS — F4321 Adjustment disorder with depressed mood: Secondary | ICD-10-CM | POA: Diagnosis not present

## 2018-05-15 ENCOUNTER — Encounter: Payer: Self-pay | Admitting: Family Medicine

## 2018-05-20 ENCOUNTER — Telehealth: Payer: Self-pay

## 2018-05-20 DIAGNOSIS — F4321 Adjustment disorder with depressed mood: Secondary | ICD-10-CM | POA: Diagnosis not present

## 2018-05-20 NOTE — Telephone Encounter (Signed)
Pt called nurse line stating he picked up his adderall rx and when he got home realized the rx is not the extended release. Pt stated he did open it. The pharmacy will not take it back at this point. Please advise.

## 2018-05-24 MED ORDER — AMPHETAMINE-DEXTROAMPHET ER 15 MG PO CP24: 15.0000 mg | ORAL_CAPSULE | ORAL | 0 refills | Status: DC

## 2018-05-24 NOTE — Telephone Encounter (Signed)
Sent Rx to pharmacy.  Spoke with pharmacy.  Advised pharmacy that patient will be bringing his Adderall to return.  Pharmacy noted that he would not be able to receive a refund.  Call patient advised him of this.  He is planning to pick up Adderall XR today.

## 2018-05-31 ENCOUNTER — Other Ambulatory Visit: Payer: Self-pay | Admitting: Family Medicine

## 2018-06-03 DIAGNOSIS — F4321 Adjustment disorder with depressed mood: Secondary | ICD-10-CM | POA: Diagnosis not present

## 2018-06-10 DIAGNOSIS — F4321 Adjustment disorder with depressed mood: Secondary | ICD-10-CM | POA: Diagnosis not present

## 2018-06-14 ENCOUNTER — Other Ambulatory Visit: Payer: Self-pay | Admitting: Family Medicine

## 2018-06-15 MED ORDER — AMPHETAMINE-DEXTROAMPHET ER 15 MG PO CP24: 15.0000 mg | ORAL_CAPSULE | ORAL | 0 refills | Status: DC

## 2018-06-17 DIAGNOSIS — F4321 Adjustment disorder with depressed mood: Secondary | ICD-10-CM | POA: Diagnosis not present

## 2018-06-24 DIAGNOSIS — F4321 Adjustment disorder with depressed mood: Secondary | ICD-10-CM | POA: Diagnosis not present

## 2018-07-01 DIAGNOSIS — F4321 Adjustment disorder with depressed mood: Secondary | ICD-10-CM | POA: Diagnosis not present

## 2018-07-08 DIAGNOSIS — F4321 Adjustment disorder with depressed mood: Secondary | ICD-10-CM | POA: Diagnosis not present

## 2018-07-12 DIAGNOSIS — Z20828 Contact with and (suspected) exposure to other viral communicable diseases: Secondary | ICD-10-CM | POA: Diagnosis not present

## 2018-07-15 DIAGNOSIS — F4321 Adjustment disorder with depressed mood: Secondary | ICD-10-CM | POA: Diagnosis not present

## 2018-07-22 ENCOUNTER — Other Ambulatory Visit: Payer: Self-pay | Admitting: Family Medicine

## 2018-07-22 MED ORDER — AMPHETAMINE-DEXTROAMPHET ER 15 MG PO CP24: 15.0000 mg | ORAL_CAPSULE | ORAL | 0 refills | Status: DC

## 2018-07-29 DIAGNOSIS — F4321 Adjustment disorder with depressed mood: Secondary | ICD-10-CM | POA: Diagnosis not present

## 2018-08-05 DIAGNOSIS — F4321 Adjustment disorder with depressed mood: Secondary | ICD-10-CM | POA: Diagnosis not present

## 2018-08-19 ENCOUNTER — Other Ambulatory Visit: Payer: Self-pay | Admitting: Family Medicine

## 2018-08-19 DIAGNOSIS — F4321 Adjustment disorder with depressed mood: Secondary | ICD-10-CM | POA: Diagnosis not present

## 2018-08-20 MED ORDER — AMPHETAMINE-DEXTROAMPHET ER 15 MG PO CP24: 15.0000 mg | ORAL_CAPSULE | ORAL | 0 refills | Status: DC

## 2018-08-31 ENCOUNTER — Encounter: Payer: Self-pay | Admitting: Family Medicine

## 2018-09-02 DIAGNOSIS — F4321 Adjustment disorder with depressed mood: Secondary | ICD-10-CM | POA: Diagnosis not present

## 2018-09-14 ENCOUNTER — Other Ambulatory Visit: Payer: Self-pay | Admitting: Family Medicine

## 2018-09-15 DIAGNOSIS — F4321 Adjustment disorder with depressed mood: Secondary | ICD-10-CM | POA: Diagnosis not present

## 2018-09-15 MED ORDER — AMPHETAMINE-DEXTROAMPHET ER 15 MG PO CP24: 15.0000 mg | ORAL_CAPSULE | ORAL | 0 refills | Status: DC

## 2018-10-03 DIAGNOSIS — Z20828 Contact with and (suspected) exposure to other viral communicable diseases: Secondary | ICD-10-CM | POA: Diagnosis not present

## 2018-10-07 ENCOUNTER — Other Ambulatory Visit: Payer: Self-pay

## 2018-10-07 ENCOUNTER — Encounter: Payer: Self-pay | Admitting: Allergy & Immunology

## 2018-10-07 ENCOUNTER — Ambulatory Visit (INDEPENDENT_AMBULATORY_CARE_PROVIDER_SITE_OTHER): Payer: BC Managed Care – PPO | Admitting: Allergy & Immunology

## 2018-10-07 ENCOUNTER — Telehealth: Payer: Self-pay

## 2018-10-07 VITALS — BP 138/50 | HR 64 | Temp 97.8°F | Resp 16 | Ht 68.0 in | Wt 182.8 lb

## 2018-10-07 DIAGNOSIS — J302 Other seasonal allergic rhinitis: Secondary | ICD-10-CM

## 2018-10-07 DIAGNOSIS — J3089 Other allergic rhinitis: Secondary | ICD-10-CM | POA: Diagnosis not present

## 2018-10-07 MED ORDER — XHANCE 93 MCG/ACT NA EXHU: 2.0000 | INHALANT_SUSPENSION | Freq: Two times a day (BID) | NASAL | 5 refills | Status: DC

## 2018-10-07 NOTE — Telephone Encounter (Signed)
Xhance prior authorization has been approved by insurance. I am sending this to the pharmacy. I have also attached case details below.  [id number];Review Type:Prior Auth;Coverage Start Date:09/07/2018;Coverage End Date:10/07/2019;

## 2018-10-07 NOTE — Progress Notes (Signed)
NEW PATIENT  Date of Service/Encounter:  10/07/18  Referring provider: Nuala Alpha, DO   Assessment:   Seasonal and perennial allergic rhinitis (ragweed, dust mites, cat, dog and cockroach)  Plan/Recommendations:   1. Seasonal and perennial allergic rhinitis - Testing today showed: ragweed, dust mites, cat, dog and cockroach - Copy of test results provided.  - Avoidance measures provided. - Stop taking: Zyrtec and Flonase - Start taking: Allegra (fexofenadine) 180mg  table once daily and Xhance (fluticasone) 1-2 sprays per nostril twice daily - You can use an extra dose of the antihistamine, if needed, for breakthrough symptoms.  - Consider nasal saline rinses 1-2 times daily to remove allergens from the nasal cavities as well as help with mucous clearance (this is especially helpful to do before the nasal sprays are given) - Consider allergy shots as a means of long-term control. - Allergy shots "re-train" and "reset" the immune system to ignore environmental allergens and decrease the resulting immune response to those allergens (sneezing, itchy watery eyes, runny nose, nasal congestion, etc).    - Allergy shots improve symptoms in 75-85% of patients.  - They are continued for 3-5 years and provide permanent immune changes. - We can discuss more at the next appointment if the medications are not working for you.  2. Return in about 3 months (around 01/07/2019). This can be an in-person, a virtual Webex or a telephone follow up visit.   Subjective:   Baba Lalich is a 33 y.o. male presenting today for evaluation of  Chief Complaint  Patient presents with   Allergic Rhinitis     runny nose, has 2 cats at home   Nasal Congestion    spring and fall, with a cough    Yousaf Hansard has a history of the following: Patient Active Problem List   Diagnosis Date Noted   Skin lesion 11/06/2017   Upper respiratory virus 04/07/2017   Mass of joint of left wrist  01/08/2017   Need for immunization against influenza 01/08/2017   Acute upper respiratory infection 01/08/2017   Asthma with acute exacerbation 08/02/2014   Bony exostosis 08/02/2014   Attention deficit hyperactivity disorder (ADHD) 04/02/2012   Anxiety 08/16/2010    History obtained from: chart review and patient.  Henos Parlin was referred by Nuala Alpha, DO.     Treg is a 33 y.o. male presenting for an evaluation of chronic rhinitis.  He reports a longstanding history of environmental allergies.  He thinks that it started when he was very young.  He has never been tested.  Symptoms seem to get better in the winter months.  The early spring and the summer are definitely the worst.  He has taken Claritin and Flonase for the past 18 months with some improvement.  He has noticed worsening symptoms when he has been off of the Claritin.  Whereas his symptoms were debilitating before, he now can function somewhat during the day.  He has never been on any other no sprays.  He was on Zyrtec but felt "yucky" with it.  He does not get antibiotics often at all.  He has no history of asthma.  He has never been on an inhaler.  He does report some lactose intolerance, which does not change how often he ingested milk at all.  Otherwise, there is no history of other atopic diseases, including asthma, food allergies, drug allergies, stinging insect allergies, eczema, urticaria or contact dermatitis. There is no significant infectious history. Vaccinations are up to date.  Past Medical History: Patient Active Problem List   Diagnosis Date Noted   Skin lesion 11/06/2017   Upper respiratory virus 04/07/2017   Mass of joint of left wrist 01/08/2017   Need for immunization against influenza 01/08/2017   Acute upper respiratory infection 01/08/2017   Asthma with acute exacerbation 08/02/2014   Bony exostosis 08/02/2014   Attention deficit hyperactivity disorder (ADHD) 04/02/2012     Anxiety 08/16/2010    Medication List:  Allergies as of 10/07/2018   No Known Allergies     Medication List       Accurate as of October 07, 2018 12:01 PM. If you have any questions, ask your nurse or doctor.        STOP taking these medications   ibuprofen 800 MG tablet Commonly known as: ADVIL Stopped by: Valentina Shaggy, MD     TAKE these medications   amphetamine-dextroamphetamine 15 MG 24 hr capsule Commonly known as: Adderall XR Take 1 capsule by mouth every morning.   fluticasone 50 MCG/ACT nasal spray Commonly known as: FLONASE Place 1-2 sprays into both nostrils daily for 7 days. What changed: Another medication with the same name was added. Make sure you understand how and when to take each. Changed by: Valentina Shaggy, MD   Cyril Loosen MCG/ACT Exhu Generic drug: Fluticasone Propionate Place 2 sprays into both nostrils 2 (two) times daily. What changed: You were already taking a medication with the same name, and this prescription was added. Make sure you understand how and when to take each. Changed by: Valentina Shaggy, MD       Birth History: non-contributory  Developmental History: non-contributory  Past Surgical History: History reviewed. No pertinent surgical history.   Family History: Family History  Problem Relation Age of Onset   Diabetes Paternal Grandmother    Alcoholism Mother    Heart attack Father      Social History: Bamidele lives at home with his girlfriend.  They have 2 cats and 1 dog.  His girlfriend brought the dogs to the relationship.  He works as a Orthoptist at [employer].  He has had a background in psychology, which ironically does help with his sales pitch.  They live in a house that is 32 years old.  There is laminate in the main living areas and carpeting in the bedrooms.  They have electric heating and central cooling.  He does have dust mite covers on his bed, but not his pillows.  There  is no tobacco exposure.   Review of Systems  Constitutional: Negative.  Negative for fever, malaise/fatigue and weight loss.  HENT: Positive for congestion and sore throat. Negative for ear discharge and ear pain.        Positive for postnasal drip.  Eyes: Negative for pain, discharge and redness.  Respiratory: Negative for cough, sputum production, shortness of breath and wheezing.   Cardiovascular: Negative.  Negative for chest pain and palpitations.  Gastrointestinal: Negative for abdominal pain, constipation, diarrhea, heartburn, nausea and vomiting.  Skin: Negative.  Negative for itching and rash.  Neurological: Negative for dizziness and headaches.  Endo/Heme/Allergies: Negative for environmental allergies. Does not bruise/bleed easily.       Objective:   Blood pressure (!) 138/50, pulse 64, temperature 97.8 F (36.6 C), temperature source Temporal, resp. rate 16, height 5\' 8"  (1.727 m), weight 182 lb 12.8 oz (82.9 kg), SpO2 97 %. Body mass index is 27.79 kg/m.   Physical Exam:   Physical  Exam  Constitutional: He appears well-developed.  Pleasant male.  HENT:  Head: Normocephalic and atraumatic.  Right Ear: Tympanic membrane, external ear and ear canal normal.  Left Ear: Tympanic membrane, external ear and ear canal normal.  Nose: Mucosal edema and rhinorrhea present. No nasal deformity or septal deviation. No epistaxis. Right sinus exhibits no maxillary sinus tenderness and no frontal sinus tenderness. Left sinus exhibits no maxillary sinus tenderness and no frontal sinus tenderness.  Mouth/Throat: Uvula is midline and oropharynx is clear and moist. Mucous membranes are not pale and not dry.  Tonsils 2+ bilaterally without exudates.  He does have enlarged turbinates with clear discharge.  There is some cobblestoning in the posterior oropharynx.  Eyes: Pupils are equal, round, and reactive to light. Conjunctivae and EOM are normal. Right eye exhibits no chemosis and no  discharge. Left eye exhibits no chemosis and no discharge. Right conjunctiva is not injected. Left conjunctiva is not injected.  Cardiovascular: Normal rate, regular rhythm and normal heart sounds.  Respiratory: Effort normal and breath sounds normal. No accessory muscle usage. No tachypnea. No respiratory distress. He has no wheezes. He has no rhonchi. He has no rales. He exhibits no tenderness.  Moving air well in all lung fields.  No increased work of breathing.  Lymphadenopathy:    He has no cervical adenopathy.  Neurological: He is alert.  Skin: No abrasion, no petechiae and no rash noted. Rash is not papular, not vesicular and not urticarial. No erythema. No pallor.  Psychiatric: He has a normal mood and affect.     Diagnostic studies:   Allergy Studies:    Airborne Adult Perc - 10/07/18 1000    Time Antigen Placed  1025    Allergen Manufacturer  Lavella Hammock    Location  Back    Number of Test  59    1. Control-Buffer 50% Glycerol  Negative    2. Control-Histamine 1 mg/ml  2+    3. Albumin saline  Negative    4. [place]  Negative    5. [place]  Negative    6. Johnson  Negative    7. [place] Blue  Negative    8. Meadow Fescue  Negative    9. Perennial Rye  Negative    10. Sweet Vernal  Negative    11. Timothy  Negative    12. Cocklebur  Negative    13. Burweed Marshelder  Negative    14. Ragweed, short  2+    15. Ragweed, Giant  Negative    16. Plantain,  English  Negative    17. Lamb's Quarters  Negative    18. Sheep Sorrell  Negative    19. Rough Pigweed  Negative    20. Marsh Elder, Rough  Negative    21. Mugwort, Common  Negative    22. Ash mix  Negative    23. Birch mix  Negative    24. Beech American  Negative    25. Box, Elder  Negative    26. Cedar, red  Negative    27. Cottonwood, [place]  Negative    28. Elm mix  Negative    29. Hickory mix  Negative    30. Maple mix  Negative    31. Oak, [place] mix  Negative    32. Pecan Pollen  Negative    33. Pine mix   Negative    34. Sycamore Eastern  Negative    35. Crestwood, Black Pollen  Negative    36.  Alternaria alternata  Negative    37. Cladosporium Herbarum  Negative    38. Aspergillus mix  Negative    39. Penicillium mix  Negative    40. Bipolaris sorokiniana (Helminthosporium)  Negative    41. Drechslera spicifera (Curvularia)  Negative    42. Mucor plumbeus  Negative    43. Fusarium moniliforme  Negative    44. Aureobasidium pullulans (pullulara)  Negative    45. Rhizopus oryzae  Negative    46. Botrytis cinera  Negative    47. Epicoccum nigrum  Negative    48. Phoma betae  Negative    49. Candida Albicans  Negative    50. Trichophyton mentagrophytes  Negative    51. Mite, D Farinae  5,000 AU/ml  4+    52. Mite, D Pteronyssinus  5,000 AU/ml  4+    53. Cat Hair 10,000 BAU/ml  3+    54.  Dog Epithelia  Negative    55. Mixed Feathers  Negative    56. Horse Epithelia  Negative    57. Cockroach, German  Negative    58. Mouse  Negative    59. Tobacco Leaf  Negative     Intradermal - 10/07/18 1114    Time Antigen Placed  1112    Allergen Manufacturer  Lavella Hammock    Location  Arm    Number of Test  12    Control  Negative    [place]  Negative    Johnson  Negative    7 Grass  Negative    Weed mix  Negative    Tree mix  Negative    Mold 1  Negative    Mold 2  Negative    Mold 3  Negative    Mold 4  Negative    Dog  4+    Cockroach  3+       Allergy testing results were read and interpreted by myself, documented by clinical staff.         Salvatore Marvel, MD [facility] [place]

## 2018-10-07 NOTE — Patient Instructions (Addendum)
1. Seasonal and perennial allergic rhinitis - Testing today showed: ragweed, dust mites, cat, dog and cockroach - Copy of test results provided.  - Avoidance measures provided. - Stop taking: Zyrtec and Flonase - Start taking: Allegra (fexofenadine) 180mg  table once daily and Xhance (fluticasone) 1-2 sprays per nostril twice daily - You can use an extra dose of the antihistamine, if needed, for breakthrough symptoms.  - Consider nasal saline rinses 1-2 times daily to remove allergens from the nasal cavities as well as help with mucous clearance (this is especially helpful to do before the nasal sprays are given) - Consider allergy shots as a means of long-term control. - Allergy shots "re-train" and "reset" the immune system to ignore environmental allergens and decrease the resulting immune response to those allergens (sneezing, itchy watery eyes, runny nose, nasal congestion, etc).    - Allergy shots improve symptoms in 75-85% of patients.  - They are continued for 3-5 years and provide permanent immune changes. - We can discuss more at the next appointment if the medications are not working for you.  2. Return in about 3 months (around 01/07/2019). This can be an in-person, a virtual Webex or a telephone follow up visit.   Please inform us of any Emergency Department visits, hospitalizations, or changes in symptoms. Call us before going to the ED for breathing or allergy symptoms since we might be able to fit you in for a sick visit. Feel free to contact us anytime with any questions, problems, or concerns.  It was a pleasure to meet you today!  Websites that have reliable patient information: 1. American Academy of Asthma, Allergy, and Immunology: www.aaaai.org 2. Food Allergy Research and Education (FARE): foodallergy.org 3. Mothers of Asthmatics: http://www.asthmacommunitynetwork.org 4. American College of Allergy, Asthma, and Immunology: www.acaai.org  "Like" [place] on Facebook and  Instagram for our latest updates!      Make sure you are registered to vote! If you have moved or changed any of your contact information, you will need to get this updated before voting!  In some cases, you MAY be able to register to vote online: [email or web address]    Voter ID laws are NOT going into effect for the General Election in November 2020! DO NOT let this stop you from exercising your right to vote!   Absentee voting is the SAFEST way to vote during the coronavirus pandemic!   Download and print an absentee ballot request form at [email or web address] or you can scan the QR code below with your smart phone:      More information on absentee ballots can be found here: [email or web address]   Reducing Pollen Exposure  The American Academy of Allergy, Asthma and Immunology suggests the following steps to reduce your exposure to pollen during allergy seasons.    1. Do not hang sheets or clothing out to dry; pollen may collect on these items. 2. Do not mow lawns or spend time around freshly cut grass; mowing stirs up pollen. 3. Keep windows closed at night.  Keep car windows closed while driving. 4. Minimize morning activities outdoors, a time when pollen counts are usually at their highest. 5. Stay indoors as much as possible when pollen counts or humidity is high and on windy days when pollen tends to remain in the air longer. 6. Use air conditioning when possible.  Many air conditioners have filters that trap the pollen spores. 7. Use a HEPA room air filter to remove pollen form the indoor air you breathe.  Control  of [place] dust mites play a major role in allergic asthma and rhinitis.  They occur in environments with high humidity wherever human skin, the food for dust mites is found. High levels have been detected in dust obtained from mattresses, pillows, carpets, upholstered furniture, bed  covers, clothes and soft toys.  The principal allergen of the house dust mite is found in its feces.  A gram of dust may contain 1,000 mites and 250,000 fecal particles.  Mite antigen is easily measured in the air during house cleaning activities.    1. Encase mattresses, including the box spring, and pillow, in an air tight cover.  Seal the zipper end of the encased mattresses with wide adhesive tape. 2. Wash the bedding in water of 130 degrees Farenheit weekly.  Avoid cotton comforters/quilts and flannel bedding: the most ideal bed covering is the dacron comforter. 3. Remove all upholstered furniture from the bedroom. 4. Remove carpets, carpet padding, rugs, and non-washable window drapes from the bedroom.  Wash drapes weekly or use plastic window coverings. 5. Remove all non-washable stuffed toys from the bedroom.  Wash stuffed toys weekly. 6. Have the room cleaned frequently with a vacuum cleaner and a damp dust-mop.  The patient should not be in a room which is being cleaned and should wait 1 hour after cleaning before going into the room. 7. Close and seal all heating outlets in the bedroom.  Otherwise, the room will become filled with dust-laden air.  An electric heater can be used to heat the room. 8. Reduce indoor humidity to less than 50%.  Do not use a humidifier.  Control of Dog or Cat Allergen  Avoidance is the best way to manage a dog or cat allergy. If you have a dog or cat and are allergic to dog or cats, consider removing the dog or cat from the home. If you have a dog or cat but don't want to find it a new home, or if your family wants a pet even though someone in the household is allergic, here are some strategies that may help keep symptoms at bay:  1. Keep the pet out of your bedroom and restrict it to only a few rooms. Be advised that keeping the dog or cat in only one room will not limit the allergens to that room. 2. Don't pet, hug or kiss the dog or cat; if you do, wash your  hands with soap and water. 3. High-efficiency particulate air (HEPA) cleaners run continuously in a bedroom or living room can reduce allergen levels over time. 4. Regular use of a high-efficiency vacuum cleaner or a central vacuum can reduce allergen levels. 5. Giving your dog or cat a bath at least once a week can reduce airborne allergen.  Control of Cockroach Allergen  Cockroach allergen has been identified as an important cause of acute attacks of asthma, especially in urban settings.  There are fifty-five species of cockroach that exist in the [place], however only three, the [place], [place] species produce allergen that can affect patients with Asthma.  Allergens can be obtained from fecal particles, egg casings and secretions from cockroaches.    1. Remove food sources. 2. Reduce access to water. 3. Seal access and entry points. 4. Spray runways with 0.5-1% Diazinon or Chlorpyrifos 5. Blow boric acid power under stoves and refrigerator. 6. Place bait stations (hydramethylnon) at feeding sites.  Allergy Shots   Allergies are the result  of a chain reaction that starts in the immune system. Your immune system controls how your body defends itself. For instance, if you have an allergy to pollen, your immune system identifies pollen as an invader or allergen. Your immune system overreacts by producing antibodies called Immunoglobulin E (IgE). These antibodies travel to cells that release chemicals, causing an allergic reaction.  The concept behind allergy immunotherapy, whether it is received in the form of shots or tablets, is that the immune system can be desensitized to specific allergens that trigger allergy symptoms. Although it requires time and patience, the payback can be long-term relief.  How Do Allergy Shots Work?  Allergy shots work much like a vaccine. Your body responds to injected amounts of a particular allergen given in increasing doses, eventually  developing a resistance and tolerance to it. Allergy shots can lead to decreased, minimal or no allergy symptoms.  There generally are two phases: build-up and maintenance. Build-up often ranges from three to six months and involves receiving injections with increasing amounts of the allergens. The shots are typically given once or twice a week, though more rapid build-up schedules are sometimes used.  The maintenance phase begins when the most effective dose is reached. This dose is different for each person, depending on how allergic you are and your response to the build-up injections. Once the maintenance dose is reached, there are longer periods between injections, typically two to four weeks.  Occasionally doctors give cortisone-type shots that can temporarily reduce allergy symptoms. These types of shots are different and should not be confused with allergy immunotherapy shots.  Who Can Be Treated with Allergy Shots?  Allergy shots may be a good treatment approach for people with allergic rhinitis (hay fever), allergic asthma, conjunctivitis (eye allergy) or stinging insect allergy.   Before deciding to begin allergy shots, you should consider:  . The length of allergy season and the severity of your symptoms . Whether medications and/or changes to your environment can control your symptoms . Your desire to avoid long-term medication use . Time: allergy immunotherapy requires a major time commitment . Cost: may vary depending on your insurance coverage  Allergy shots for children age 103 and older are effective and often well tolerated. They might prevent the onset of new allergen sensitivities or the progression to asthma.  Allergy shots are not started on patients who are pregnant but can be continued on patients who become pregnant while receiving them. In some patients with other medical conditions or who take certain common medications, allergy shots may be of risk. It is important  to mention other medications you talk to your allergist.   When Will I Feel Better?  Some may experience decreased allergy symptoms during the build-up phase. For others, it may take as long as 12 months on the maintenance dose. If there is no improvement after a year of maintenance, your allergist will discuss other treatment options with you.  If you aren't responding to allergy shots, it may be because there is not enough dose of the allergen in your vaccine or there are missing allergens that were not identified during your allergy testing. Other reasons could be that there are high levels of the allergen in your environment or major exposure to non-allergic triggers like tobacco smoke.  What Is the Length of Treatment?  Once the maintenance dose is reached, allergy shots are generally continued for three to five years. The decision to stop should be discussed with your allergist at that  time. Some people may experience a permanent reduction of allergy symptoms. Others may relapse and a longer course of allergy shots can be considered.  What Are the Possible Reactions?  The two types of adverse reactions that can occur with allergy shots are local and systemic. Common local reactions include very mild redness and swelling at the injection site, which can happen immediately or several hours after. A systemic reaction, which is less common, affects the entire body or a particular body system. They are usually mild and typically respond quickly to medications. Signs include increased allergy symptoms such as sneezing, a stuffy nose or hives.  Rarely, a serious systemic reaction called anaphylaxis can develop. Symptoms include swelling in the throat, wheezing, a feeling of tightness in the chest, nausea or dizziness. Most serious systemic reactions develop within 30 minutes of allergy shots. This is why it is strongly recommended you wait in your doctor's office for 30 minutes after your injections.  Your allergist is trained to watch for reactions, and his or her staff is trained and equipped with the proper medications to identify and treat them.  Who Should Administer Allergy Shots?  The preferred location for receiving shots is your prescribing allergist's office. Injections can sometimes be given at another facility where the physician and staff are trained to recognize and treat reactions, and have received instructions by your prescribing allergist.

## 2018-10-14 ENCOUNTER — Telehealth: Payer: Self-pay

## 2018-10-14 NOTE — Progress Notes (Signed)
We received notification from Dejesus that he is interested in pursuing allergen immunotherapy. Prescriptions written and routed to the Immunotherapy Team.    Salvatore Marvel, MD  [facility]

## 2018-10-14 NOTE — Addendum Note (Signed)
Addended by: Valentina Shaggy on: 10/14/2018 05:26 PM   Modules accepted: Orders

## 2018-10-14 NOTE — Telephone Encounter (Signed)
Patient called to start allergy injections in two weeks. He is scheduled.  Thanks

## 2018-10-19 NOTE — Progress Notes (Signed)
VIALS EXP 10-19-19

## 2018-10-20 ENCOUNTER — Other Ambulatory Visit: Payer: Self-pay | Admitting: Family Medicine

## 2018-10-20 DIAGNOSIS — J3081 Allergic rhinitis due to animal (cat) (dog) hair and dander: Secondary | ICD-10-CM | POA: Diagnosis not present

## 2018-10-20 MED ORDER — AMPHETAMINE-DEXTROAMPHET ER 15 MG PO CP24: 15.0000 mg | ORAL_CAPSULE | ORAL | 0 refills | Status: DC

## 2018-10-20 NOTE — Telephone Encounter (Signed)
I already wrote the script. I will route to Marcie Bal to make sure that she knows he is coming in.  Salvatore Marvel, MD [facility]

## 2018-10-21 DIAGNOSIS — J3089 Other allergic rhinitis: Secondary | ICD-10-CM | POA: Diagnosis not present

## 2018-10-26 IMAGING — CR DG WRIST COMPLETE 3+V*L*
4 series · 4 of 4 positions shown · non-contrast
Comparison: None.

CLINICAL DATA: Left wrist nodule.

EXAM:
LEFT WRIST - COMPLETE 3+ VIEW

[x wrist pa left]
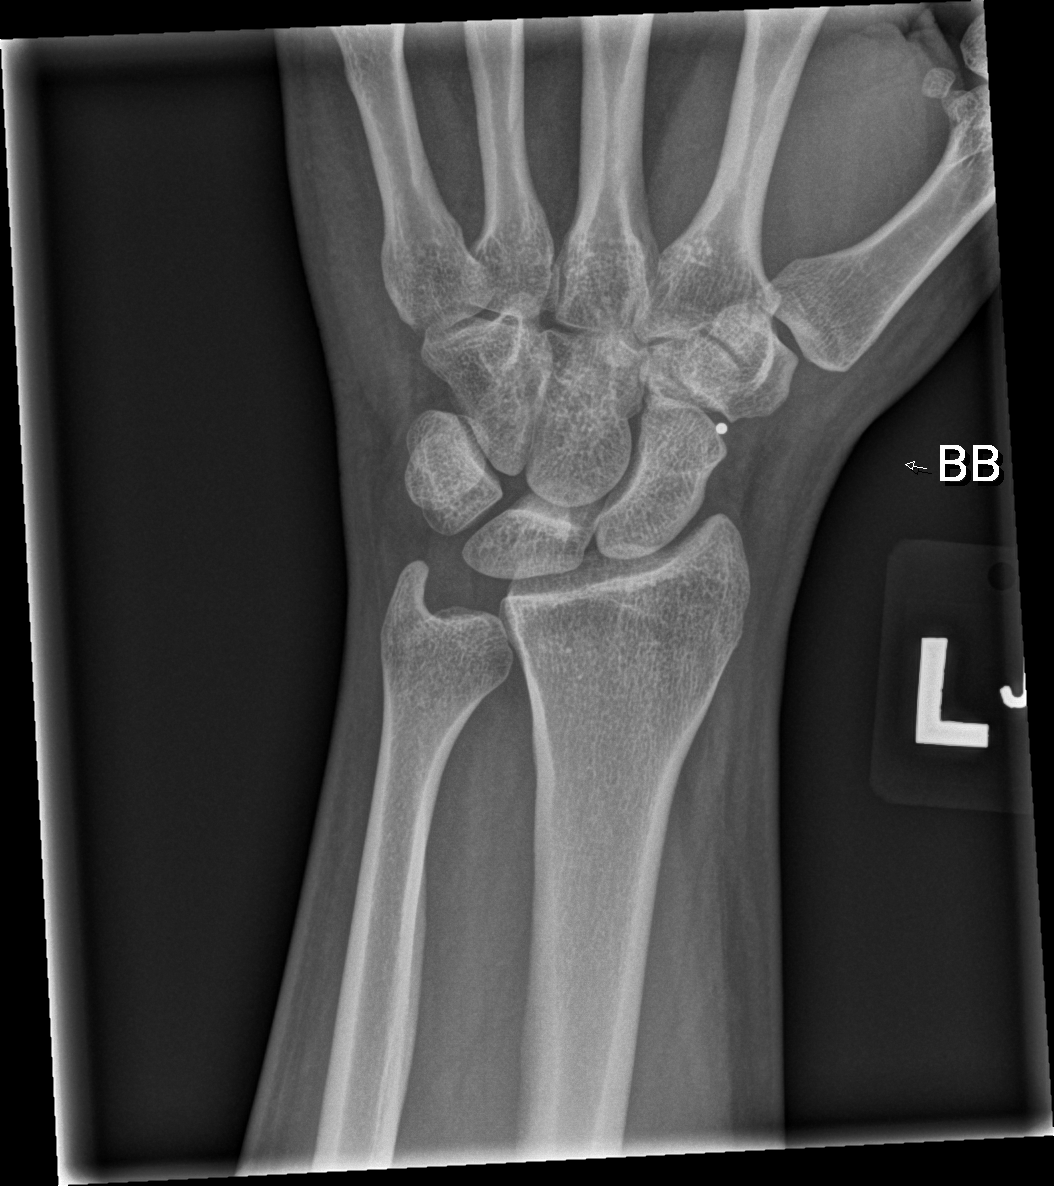

[x wrist navicular view left]
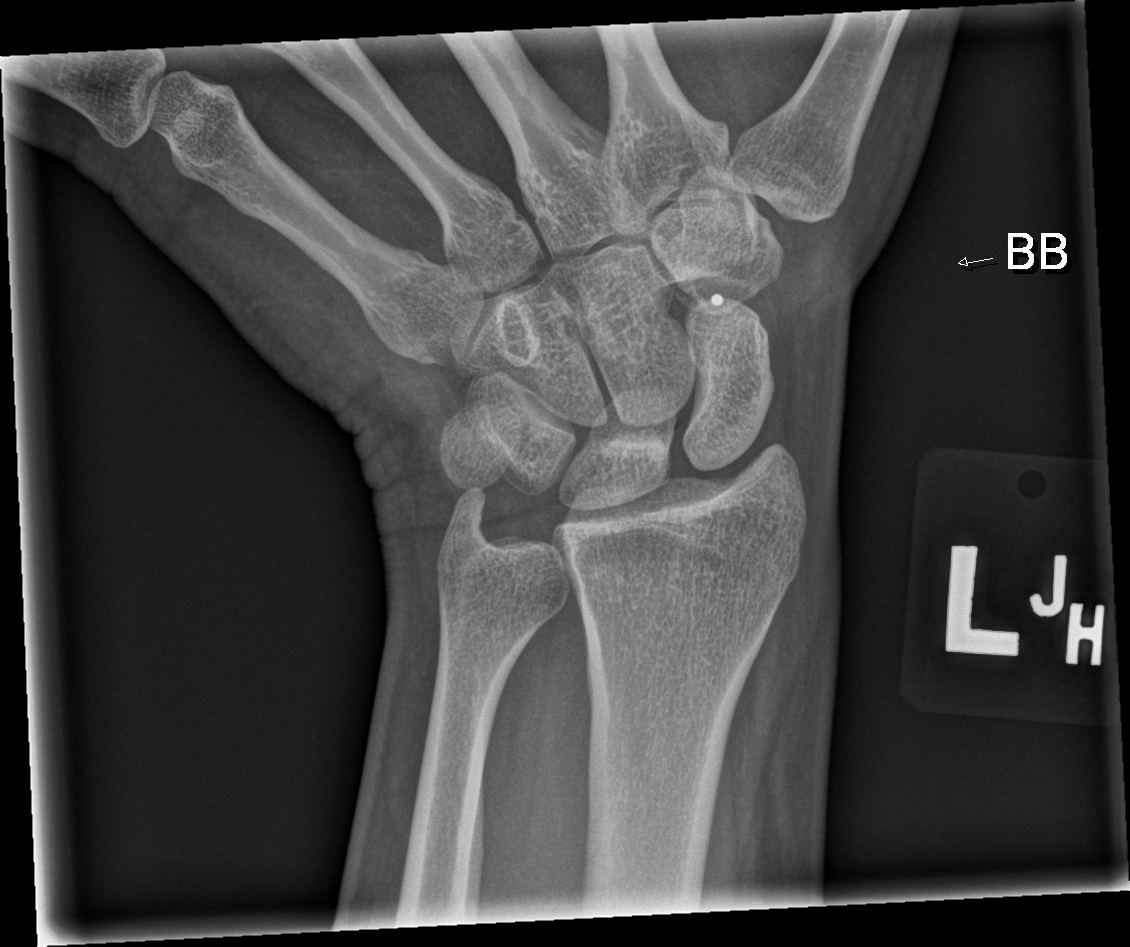

[x wrist obl left]
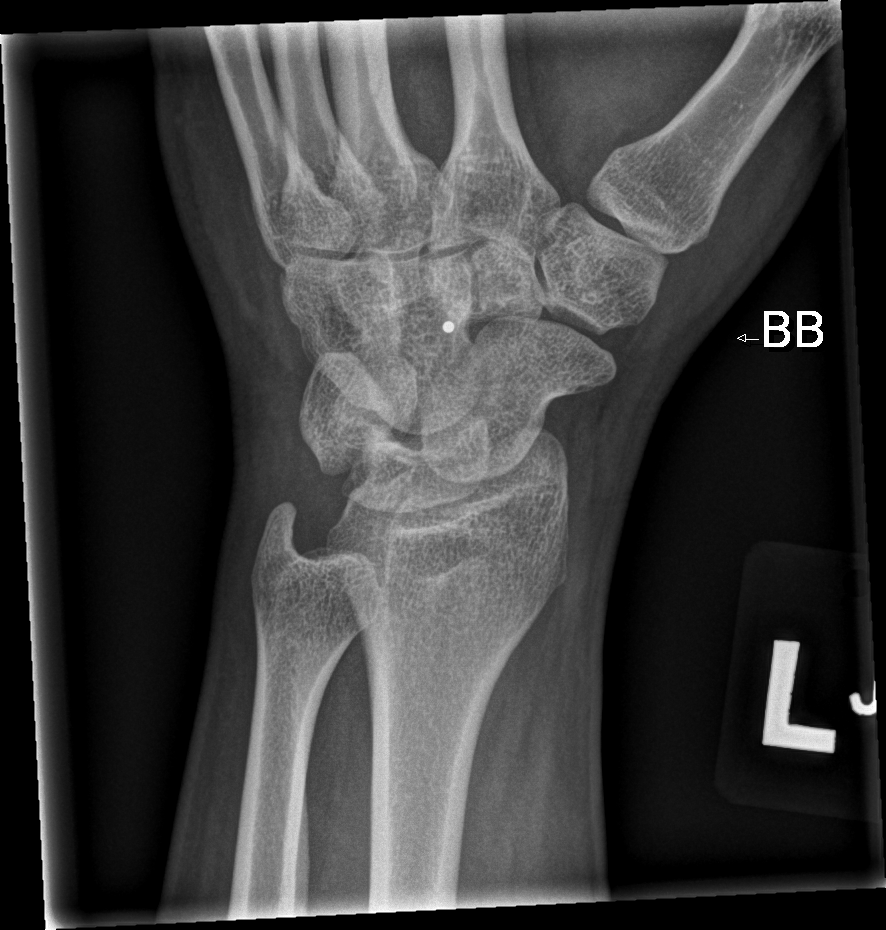

[x wrist lat left]
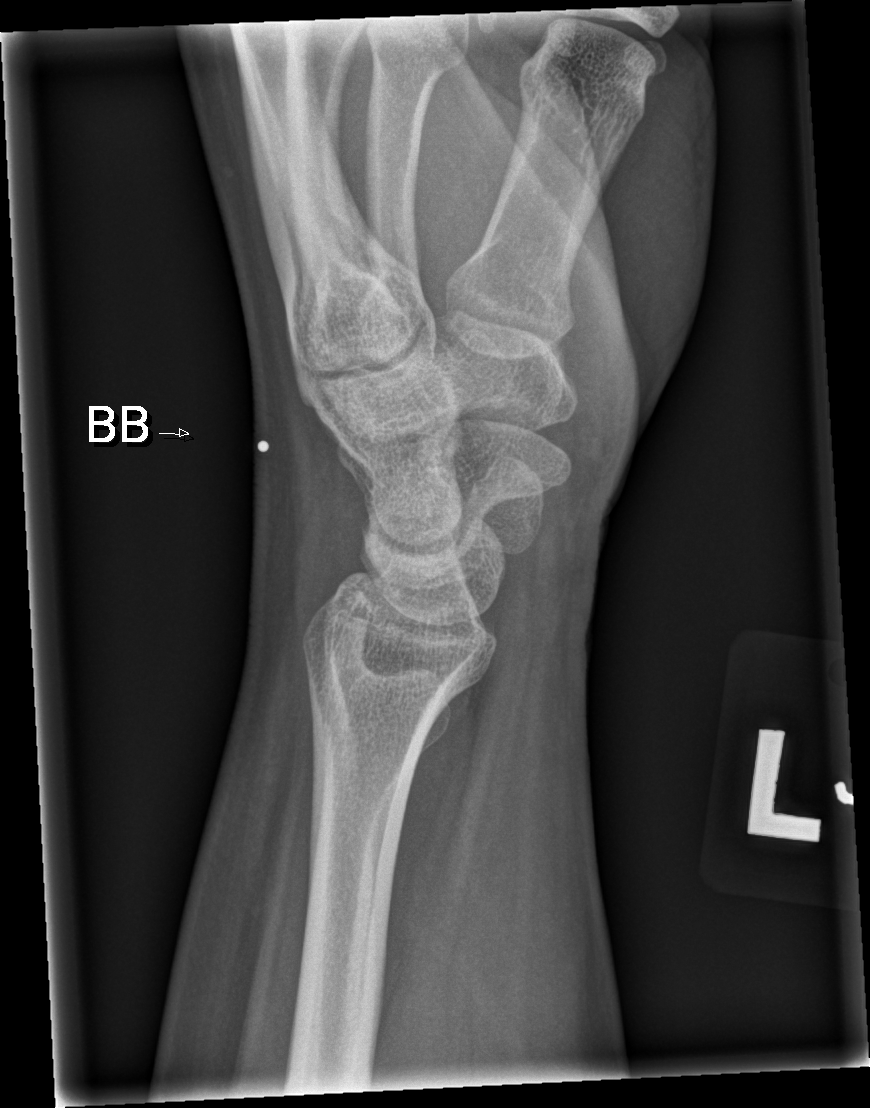

[4 of 4 positions shown; findings below may reference images not displayed]

FINDINGS: There is no evidence of fracture or dislocation. There is no
evidence of arthropathy or other focal bone abnormality. Soft
tissues are unremarkable. No abnormality in the area of concern
marked with a BB.
IMPRESSION: Negative.

## 2018-10-28 ENCOUNTER — Other Ambulatory Visit: Payer: Self-pay

## 2018-10-28 ENCOUNTER — Ambulatory Visit (INDEPENDENT_AMBULATORY_CARE_PROVIDER_SITE_OTHER): Payer: BC Managed Care – PPO

## 2018-10-28 DIAGNOSIS — J3089 Other allergic rhinitis: Secondary | ICD-10-CM

## 2018-10-28 DIAGNOSIS — J302 Other seasonal allergic rhinitis: Secondary | ICD-10-CM | POA: Diagnosis not present

## 2018-10-28 MED ORDER — AUVI-Q 0.3 MG/0.3ML IJ SOAJ: 0.3000 mg | INTRAMUSCULAR | 1 refills | Status: DC | PRN

## 2018-10-28 NOTE — Progress Notes (Signed)
Immunotherapy   Patient Details  Name: Nathaniel Soto MRN: [id number] Date of Birth: October 19, 1985  10/28/2018  Nathaniel Soto started injections for  C-D-RW & DM-CR Following schedule: B  Frequency:1-2 times per week Epi-Pen:Prescription for Epi-Pen given(Auvi-Q 0.3 mg) Consent signed and patient instructions given. Patient waited 30 minutes post injection. No local or systemic reactions. Patient does get lightheaded/dizzy with injections/blood draws so we do need to ask about laying him down or have him seated for his injections.   Nathaniel Soto 10/28/2018, 9:02 AM

## 2018-11-04 ENCOUNTER — Ambulatory Visit (INDEPENDENT_AMBULATORY_CARE_PROVIDER_SITE_OTHER): Payer: BC Managed Care – PPO

## 2018-11-04 DIAGNOSIS — J3089 Other allergic rhinitis: Secondary | ICD-10-CM | POA: Diagnosis not present

## 2018-11-04 DIAGNOSIS — J302 Other seasonal allergic rhinitis: Secondary | ICD-10-CM

## 2018-11-11 ENCOUNTER — Ambulatory Visit (INDEPENDENT_AMBULATORY_CARE_PROVIDER_SITE_OTHER): Payer: BC Managed Care – PPO | Admitting: *Deleted

## 2018-11-11 DIAGNOSIS — J309 Allergic rhinitis, unspecified: Secondary | ICD-10-CM

## 2018-11-19 ENCOUNTER — Ambulatory Visit (INDEPENDENT_AMBULATORY_CARE_PROVIDER_SITE_OTHER): Payer: BC Managed Care – PPO | Admitting: *Deleted

## 2018-11-19 DIAGNOSIS — J309 Allergic rhinitis, unspecified: Secondary | ICD-10-CM | POA: Diagnosis not present

## 2018-11-23 ENCOUNTER — Other Ambulatory Visit: Payer: Self-pay | Admitting: Family Medicine

## 2018-11-23 MED ORDER — AMPHETAMINE-DEXTROAMPHET ER 15 MG PO CP24: 15.0000 mg | ORAL_CAPSULE | ORAL | 0 refills | Status: DC

## 2018-11-24 ENCOUNTER — Ambulatory Visit (INDEPENDENT_AMBULATORY_CARE_PROVIDER_SITE_OTHER): Payer: BC Managed Care – PPO

## 2018-11-24 DIAGNOSIS — J309 Allergic rhinitis, unspecified: Secondary | ICD-10-CM | POA: Diagnosis not present

## 2018-12-02 ENCOUNTER — Ambulatory Visit (INDEPENDENT_AMBULATORY_CARE_PROVIDER_SITE_OTHER): Payer: BC Managed Care – PPO | Admitting: *Deleted

## 2018-12-02 DIAGNOSIS — J309 Allergic rhinitis, unspecified: Secondary | ICD-10-CM

## 2018-12-09 ENCOUNTER — Ambulatory Visit (INDEPENDENT_AMBULATORY_CARE_PROVIDER_SITE_OTHER): Payer: BC Managed Care – PPO

## 2018-12-09 DIAGNOSIS — J309 Allergic rhinitis, unspecified: Secondary | ICD-10-CM | POA: Diagnosis not present

## 2018-12-16 ENCOUNTER — Ambulatory Visit (INDEPENDENT_AMBULATORY_CARE_PROVIDER_SITE_OTHER): Payer: BC Managed Care – PPO

## 2018-12-16 DIAGNOSIS — J309 Allergic rhinitis, unspecified: Secondary | ICD-10-CM | POA: Diagnosis not present

## 2018-12-20 ENCOUNTER — Other Ambulatory Visit: Payer: Self-pay | Admitting: Family Medicine

## 2018-12-21 MED ORDER — AMPHETAMINE-DEXTROAMPHET ER 15 MG PO CP24: 15.0000 mg | ORAL_CAPSULE | ORAL | 0 refills | Status: DC

## 2018-12-23 ENCOUNTER — Ambulatory Visit (INDEPENDENT_AMBULATORY_CARE_PROVIDER_SITE_OTHER): Payer: BC Managed Care – PPO | Admitting: *Deleted

## 2018-12-23 DIAGNOSIS — J309 Allergic rhinitis, unspecified: Secondary | ICD-10-CM

## 2018-12-30 ENCOUNTER — Ambulatory Visit (INDEPENDENT_AMBULATORY_CARE_PROVIDER_SITE_OTHER): Payer: BC Managed Care – PPO

## 2018-12-30 DIAGNOSIS — J309 Allergic rhinitis, unspecified: Secondary | ICD-10-CM

## 2019-01-05 ENCOUNTER — Ambulatory Visit (INDEPENDENT_AMBULATORY_CARE_PROVIDER_SITE_OTHER): Payer: BC Managed Care – PPO | Admitting: *Deleted

## 2019-01-05 DIAGNOSIS — J309 Allergic rhinitis, unspecified: Secondary | ICD-10-CM | POA: Diagnosis not present

## 2019-01-18 ENCOUNTER — Ambulatory Visit: Payer: Self-pay | Admitting: Allergy & Immunology

## 2019-01-18 ENCOUNTER — Ambulatory Visit (INDEPENDENT_AMBULATORY_CARE_PROVIDER_SITE_OTHER): Payer: BC Managed Care – PPO

## 2019-01-18 DIAGNOSIS — J309 Allergic rhinitis, unspecified: Secondary | ICD-10-CM

## 2019-01-20 ENCOUNTER — Other Ambulatory Visit: Payer: Self-pay | Admitting: Family Medicine

## 2019-01-21 MED ORDER — AMPHETAMINE-DEXTROAMPHET ER 15 MG PO CP24: 15.0000 mg | ORAL_CAPSULE | ORAL | 0 refills | Status: DC

## 2019-01-27 ENCOUNTER — Ambulatory Visit (INDEPENDENT_AMBULATORY_CARE_PROVIDER_SITE_OTHER): Payer: BC Managed Care – PPO

## 2019-01-27 DIAGNOSIS — J309 Allergic rhinitis, unspecified: Secondary | ICD-10-CM | POA: Diagnosis not present

## 2019-02-02 ENCOUNTER — Ambulatory Visit (INDEPENDENT_AMBULATORY_CARE_PROVIDER_SITE_OTHER): Payer: BC Managed Care – PPO | Admitting: *Deleted

## 2019-02-02 DIAGNOSIS — J309 Allergic rhinitis, unspecified: Secondary | ICD-10-CM | POA: Diagnosis not present

## 2019-02-09 ENCOUNTER — Ambulatory Visit (INDEPENDENT_AMBULATORY_CARE_PROVIDER_SITE_OTHER): Payer: BC Managed Care – PPO | Admitting: *Deleted

## 2019-02-09 DIAGNOSIS — J309 Allergic rhinitis, unspecified: Secondary | ICD-10-CM

## 2019-02-18 ENCOUNTER — Encounter: Payer: Self-pay | Admitting: Allergy & Immunology

## 2019-02-18 ENCOUNTER — Other Ambulatory Visit: Payer: Self-pay | Admitting: Family Medicine

## 2019-02-18 ENCOUNTER — Telehealth: Payer: Self-pay

## 2019-02-18 MED ORDER — XHANCE 93 MCG/ACT NA EXHU: 2.0000 | INHALANT_SUSPENSION | Freq: Two times a day (BID) | NASAL | 5 refills | Status: DC

## 2019-02-18 NOTE — Telephone Encounter (Signed)
Call to patient.  Pt would like to have his prescriptions sent to the [facility].  Medications will be free for him there with his insurance.  Asked patient if the medication Truett Perna was not available at the this pharmacy if he could call us back and we would resend to the [facility].  Also explained to patient that he was due for a f/u appointment, appointment made for 03/31/19 with Dr Ernst Bowler.  Pt agreed.  Call ended.

## 2019-02-18 NOTE — Telephone Encounter (Signed)
I have submitted a prior authorization for Xhance via covermymeds. Currently awaiting approval/denial.

## 2019-02-21 ENCOUNTER — Telehealth: Payer: Self-pay

## 2019-02-21 MED ORDER — AMPHETAMINE-DEXTROAMPHET ER 15 MG PO CP24: 15.0000 mg | ORAL_CAPSULE | ORAL | 0 refills | Status: DC

## 2019-02-21 NOTE — Telephone Encounter (Signed)
Status is currently pending.

## 2019-02-21 NOTE — Telephone Encounter (Signed)
LVM for patient to return call regarding which pharmacy he would like medication to be sent in to. Per [facility], they are not able to fill Class II drugs.   Talbot Grumbling, RN

## 2019-02-21 NOTE — Telephone Encounter (Signed)
Insurance called and asked what the patient has tried and failed and was told what nasal sprays the patient has failed.

## 2019-02-22 NOTE — Telephone Encounter (Signed)
PA for Nathaniel Soto has been denied by insurance. Insurance states that patient must try and fail Flunisolide before approval of Xhance. Please advise change in medication.

## 2019-02-22 NOTE — Telephone Encounter (Signed)
Fine.  Let us try flunisolide 2 sprays per nostril twice daily.  Please have him call us in a week to let us know how it is working or not working.  Salvatore Marvel, MD [facility] [place]

## 2019-02-23 ENCOUNTER — Ambulatory Visit (INDEPENDENT_AMBULATORY_CARE_PROVIDER_SITE_OTHER): Payer: BC Managed Care – PPO | Admitting: *Deleted

## 2019-02-23 DIAGNOSIS — J309 Allergic rhinitis, unspecified: Secondary | ICD-10-CM | POA: Diagnosis not present

## 2019-02-23 MED ORDER — FLUNISOLIDE 25 MCG/ACT (0.025%) NA SOLN: 2.0000 | Freq: Two times a day (BID) | NASAL | 5 refills | Status: DC

## 2019-02-23 NOTE — Telephone Encounter (Signed)
Patient was advised and sent the medication to [facility]. Patient is still using samples for the next several weeks or months and will finish them before starting the new medication.

## 2019-02-23 NOTE — Telephone Encounter (Signed)
Patient returns call to nurse line to inform Nathaniel Soto. Patient would like [facility] to go to [facility] [address]. This has been pended for you.

## 2019-02-23 NOTE — Addendum Note (Signed)
Addended by: Valere Dross on: 02/23/2019 12:32 PM   Modules accepted: Orders

## 2019-02-24 ENCOUNTER — Other Ambulatory Visit: Payer: Self-pay | Admitting: Family Medicine

## 2019-02-25 NOTE — Telephone Encounter (Signed)
Pt calls nurse line to check on status of getting medication sent to correct pharmacy.   Talbot Grumbling, RN

## 2019-02-25 NOTE — Telephone Encounter (Signed)
Duplicate request. Already filled.  Nathaniel Soto, [name]

## 2019-02-26 ENCOUNTER — Other Ambulatory Visit: Payer: Self-pay | Admitting: Family Medicine

## 2019-02-26 MED ORDER — AMPHETAMINE-DEXTROAMPHET ER 15 MG PO CP24: 15.0000 mg | ORAL_CAPSULE | ORAL | 0 refills | Status: DC

## 2019-03-03 ENCOUNTER — Ambulatory Visit (INDEPENDENT_AMBULATORY_CARE_PROVIDER_SITE_OTHER): Payer: BC Managed Care – PPO

## 2019-03-03 DIAGNOSIS — J309 Allergic rhinitis, unspecified: Secondary | ICD-10-CM | POA: Diagnosis not present

## 2019-03-10 ENCOUNTER — Ambulatory Visit (INDEPENDENT_AMBULATORY_CARE_PROVIDER_SITE_OTHER): Payer: BC Managed Care – PPO

## 2019-03-10 DIAGNOSIS — J309 Allergic rhinitis, unspecified: Secondary | ICD-10-CM | POA: Diagnosis not present

## 2019-03-15 DIAGNOSIS — Z20828 Contact with and (suspected) exposure to other viral communicable diseases: Secondary | ICD-10-CM | POA: Diagnosis not present

## 2019-03-29 ENCOUNTER — Telehealth (INDEPENDENT_AMBULATORY_CARE_PROVIDER_SITE_OTHER): Payer: BC Managed Care – PPO | Admitting: Family Medicine

## 2019-03-29 ENCOUNTER — Ambulatory Visit: Payer: BC Managed Care – PPO | Admitting: Family Medicine

## 2019-03-29 ENCOUNTER — Other Ambulatory Visit: Payer: Self-pay

## 2019-03-29 VITALS — BP 118/72 | HR 68 | Wt 184.4 lb

## 2019-03-29 DIAGNOSIS — F320 Major depressive disorder, single episode, mild: Secondary | ICD-10-CM | POA: Diagnosis not present

## 2019-03-29 DIAGNOSIS — J0140 Acute pansinusitis, unspecified: Secondary | ICD-10-CM

## 2019-03-29 DIAGNOSIS — F902 Attention-deficit hyperactivity disorder, combined type: Secondary | ICD-10-CM | POA: Diagnosis not present

## 2019-03-29 MED ORDER — AMOXICILLIN-POT CLAVULANATE 875-125 MG PO TABS: 1.0000 | ORAL_TABLET | Freq: Two times a day (BID) | ORAL | 0 refills | Status: AC

## 2019-03-29 MED ORDER — ESCITALOPRAM OXALATE 10 MG PO TABS: 10.0000 mg | ORAL_TABLET | Freq: Every day | ORAL | 0 refills | Status: DC

## 2019-03-29 MED ORDER — AMPHETAMINE-DEXTROAMPHET ER 15 MG PO CP24: 15.0000 mg | ORAL_CAPSULE | ORAL | 0 refills | Status: DC

## 2019-03-29 NOTE — Progress Notes (Signed)
   CHIEF COMPLAINT / HPI:  ADHD Patient reports good control with symptoms. Requesting refill.   Sinus and Ear Pain/Pressure Patient endorses sinus congestion and ear pain and pressure for 2 weeks. He states this does not feel like his allergies and he normally does not have ear fullness or pressure. No pain, no discharge from the ear, no hearing loss reported.   Dep[place]on He reports having some "down days" where he finds it difficult to get out of bed for over a month. 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 He also endorses difficulty sleeping, eating more, decreased concentration. Positive family history of depression. Patient states he would like to try Lexapro as he has researched some antidepressants and thinks it may work for him. No suicidal ideation or harmful intention.   PERTINENT  PMH / PSH: ADHD, Seasonal Allergies   OBJECTIVE: BP 118/72   Pulse 68   Wt 184 lb 6.4 oz (83.6 kg)   SpO2 97%   BMI 28.04 kg/m   Gen: Alert and Oriented x 3, NAD HEENT: Normocephalic, atraumatic, PERRLA, EOMI, TMs visible with good light reflex, non-swollen, right TM does have clear fluid behind TM, no bulging, no pain with pulling on the pinna or with insertion of otoscope. Non-tender to palpation over frontal ethmoid, or maxillary sinuses CV: RRR, no murmurs, normal S1, S2 split Resp: CTAB, no wheezing, rales, or rhonchi, comfortable work of breathing Skin: warm, dry, intact, no rashes  ASSESSMENT / PLAN:  Attention deficit hyperactivity disorder (ADHD) Refilled Adderall  Sinus infection Patient reporting congestion of sinuses with ear pressure on the right for two weeks. Has been using Flonase BID with two sprays per nare for the past two weeks. His symptoms are much more likely to be either uncontrolled allergies or a viral sinus infection. Did have a recent COVID test he reports and it was negative. Unlikely to be bacterial but  patient requesting antibiotics to cover. - Augmentin BID for 7 days to cover for bacterial sinusitis, if no improvement with antibiotics will consider ENT referral  Depression, major, single episode, mild ([place]) Meets criteria for depressive episode. - Start Lexapro 10mg  at bedtime and follow up in two weeks   Nuala Alpha, DO, PGY-3 [place]

## 2019-03-30 DIAGNOSIS — J329 Chronic sinusitis, unspecified: Secondary | ICD-10-CM | POA: Insufficient documentation

## 2019-03-30 DIAGNOSIS — F320 Major depressive disorder, single episode, mild: Secondary | ICD-10-CM | POA: Insufficient documentation

## 2019-03-30 NOTE — Assessment & Plan Note (Signed)
Meets criteria for depressive episode. - Start Lexapro 10mg  at bedtime and follow up in two weeks

## 2019-03-30 NOTE — Assessment & Plan Note (Signed)
Refilled Adderall

## 2019-03-30 NOTE — Assessment & Plan Note (Signed)
Patient reporting congestion of sinuses with ear pressure on the right for two weeks. Has been using Flonase BID with two sprays per nare for the past two weeks. His symptoms are much more likely to be either uncontrolled allergies or a viral sinus infection. Did have a recent COVID test he reports and it was negative. Unlikely to be bacterial but patient requesting antibiotics to cover. - Augmentin BID for 7 days to cover for bacterial sinusitis, if no improvement with antibiotics will consider ENT referral

## 2019-03-31 ENCOUNTER — Ambulatory Visit: Payer: Self-pay | Admitting: Allergy & Immunology

## 2019-04-07 ENCOUNTER — Ambulatory Visit: Payer: Self-pay

## 2019-04-07 ENCOUNTER — Ambulatory Visit: Payer: BC Managed Care – PPO | Admitting: Allergy & Immunology

## 2019-04-07 ENCOUNTER — Encounter: Payer: Self-pay | Admitting: Allergy & Immunology

## 2019-04-07 ENCOUNTER — Other Ambulatory Visit: Payer: Self-pay

## 2019-04-07 VITALS — BP 126/72 | HR 86 | Temp 97.8°F | Resp 18

## 2019-04-07 DIAGNOSIS — R0981 Nasal congestion: Secondary | ICD-10-CM

## 2019-04-07 DIAGNOSIS — J3089 Other allergic rhinitis: Secondary | ICD-10-CM

## 2019-04-07 DIAGNOSIS — J302 Other seasonal allergic rhinitis: Secondary | ICD-10-CM

## 2019-04-07 MED ORDER — AZELASTINE HCL 0.1 % NA SOLN: NASAL | 5 refills | Status: DC

## 2019-04-07 NOTE — Patient Instructions (Addendum)
1. Seasonal and perennial allergic rhinitis (ragweed, dust mites, cat, dog and cockroach) - Continue taking: Allegra (fexofenadine) 180mg  table once daily and Xhance (fluticasone) 1-2 sprays per nostril twice daily  - Add on: Astelin 1-2 sprays per nostril daily.  - Start the prednisone pack provided.  - Continue with allergy shots at the same schedule.   2. Return in about 6 months (around 10/05/2019). This can be an in-person, a virtual Webex or a telephone follow up visit.   Please inform us of any Emergency Department visits, hospitalizations, or changes in symptoms. Call us before going to the ED for breathing or allergy symptoms since we might be able to fit you in for a sick visit. Feel free to contact us anytime with any questions, problems, or concerns.  It was a pleasure to see you again today!  Websites that have reliable patient information: 1. American Academy of Asthma, Allergy, and Immunology: www.aaaai.org 2. Food Allergy Research and Education (FARE): foodallergy.org 3. Mothers of Asthmatics: http://www.asthmacommunitynetwork.org 4. American College of Allergy, Asthma, and Immunology: www.acaai.org   COVID-19 Vaccine Information can be found at: [email or web address].[email or web address].uk For questions related to vaccine distribution or appointments, please email [email or web address] or call [phone].     "Like" [place] on Facebook and Instagram for our latest updates!        Make sure you are registered to vote! If you have moved or changed any of your contact information, you will need to get this updated before voting!  In some cases, you MAY be able to register to vote online: [email or web address]

## 2019-04-07 NOTE — Progress Notes (Signed)
FOLLOW UP  Date of Service/Encounter:  04/07/19   Assessment:   Seasonal and perennial allergic rhinitis (ragweed, dust mites, cat, dog and cockroach) - on allergen immunotherapy  Continued nasal congestion  Plan/Recommendations:   1. Seasonal and perennial allergic rhinitis (ragweed, dust mites, cat, dog and cockroach) - Continue taking: Allegra (fexofenadine) 180mg  table once daily and Xhance (fluticasone) 1-2 sprays per nostril twice daily  - Add on: Astelin 1-2 sprays per nostril daily.  - Start the prednisone pack provided.  - Continue with allergy shots at the same schedule.   2. Return in about 6 months (around 10/05/2019). This can be an in-person, a virtual Webex or a telephone follow up visit.  Subjective:   Nathaniel Soto is a 34 y.o. male presenting today for follow up of  Chief Complaint  Patient presents with  . Allergic Rhinitis     Still has problems nasal congestion with nasal sprays.     Nathaniel Soto has a history of the following: Patient Active Problem List   Diagnosis Date Noted  . Sinus infection 03/30/2019  . Depression, major, single episode, mild ([place]) 03/30/2019  . Skin lesion 11/06/2017  . Upper respiratory virus 04/07/2017  . Mass of joint of left wrist 01/08/2017  . Need for immunization against influenza 01/08/2017  . Acute upper respiratory infection 01/08/2017  . Asthma with acute exacerbation 08/02/2014  . Bony exostosis 08/02/2014  . Attention deficit hyperactivity disorder (ADHD) 04/02/2012  . Anxiety 08/16/2010    History obtained from: chart review and patient.  Nathaniel Soto is a 34 y.o. male presenting for a follow up visit. He was last seen in August 2020 for an evaluation of allergic rhinitis. He had testing that was positive to ragweed, dust mites, cat, dog and cockroach. We stopped his cetirizine and his fluticasone and we started Allegra as well as Xhance. We also discussed allergen immunotherapy and he has since started  this.   Since the last visit, he has mostly done well. However, he continues to have a lot of mucous production. Most recently, he did go to see his PCP one week ago. He still has a lot of congestion. He has fluid on the right side. He was given amoxicillin which did not help. He was not given prednisone and cannot remember the last time that he was given prednisone.   Nathaniel Soto is on allergen immunotherapy. He receives two injections. Immunotherapy script #1 contains ragweed, cat and dog. He currently receives 0.66mL of the GREEN vial (1/1,000). Immunotherapy script #2 contains dust mites and cockroach. He currently receives 0.36mL of the GREEN vial (1/1,000). He started shots September of 2020 and not yet reached maintenance due to inconsistent follow up and most recently his cough, which has been ongoing for around one month. He has had some episodes where he feels faint, but this was a known adverse reaction to injections. He is now receiving his injections sitting down and does fine with that.   He remains on the Nathaniel Soto without much change in his symptoms. He is open to other ideas including new medications. He has seen two different ENTs in the past. He has a slightly deviated septum apparently. He saw Dr. Vinetta Bergamo in August 2016 most recently. Dr. Vinetta Bergamo felt that surgery was not indicated, whereas Dr. Ernesto Rutherford, whom he said before Dr. Vinetta Bergamo, had recommended a septoplasty. Astelin was added to work with the fluticasone. He ended up not getting a septoplasty at all.   He does have a history of  ADHD and is on Adderal for this. He was not diagnosed until he got to college. But he has been very stable on this regimen. 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   Otherwise, there have been no changes to his past medical history, surgical history, family history, or social history.    Review of Systems  Constitutional: Negative.  Negative for  chills, fever, malaise/fatigue and weight loss.  HENT: Positive for congestion. Negative for ear discharge, ear pain and sore throat.        Positive for mucous production.  Eyes: Negative for pain, discharge and redness.  Respiratory: Negative for cough, sputum production, shortness of breath and wheezing.   Cardiovascular: Negative.  Negative for chest pain and palpitations.  Gastrointestinal: Negative for abdominal pain, constipation, diarrhea, heartburn, nausea and vomiting.  Skin: Negative.  Negative for itching and rash.  Neurological: Negative for dizziness and headaches.  Endo/Heme/Allergies: Negative for environmental allergies. Does not bruise/bleed easily.       Objective:   Blood pressure 126/72, pulse 86, temperature 97.8 F (36.6 C), temperature source Temporal, resp. rate 18, SpO2 97 %. There is no height or weight on file to calculate BMI.   Physical Exam:  Physical Exam  Constitutional: He appears well-developed.  Very interactive.   HENT:  Head: Normocephalic and atraumatic.  Right Ear: Tympanic membrane, external ear and ear canal normal.  Left Ear: Tympanic membrane, external ear and ear canal normal.  Nose: Mucosal edema and rhinorrhea present. No nasal deformity or septal deviation. No epistaxis. Right sinus exhibits no maxillary sinus tenderness and no frontal sinus tenderness. Left sinus exhibits no maxillary sinus tenderness and no frontal sinus tenderness.  Mouth/Throat: Uvula is midline and oropharynx is clear and moist. Mucous membranes are not pale and not dry.  Tonsils 2+ bilaterally without discharge. He does have some cobblestoning present in the posterior oropharynx.   Eyes: Pupils are equal, round, and reactive to light. Conjunctivae and EOM are normal. Right eye exhibits no chemosis and no discharge. Left eye exhibits no chemosis and no discharge. Right conjunctiva is not injected. Left conjunctiva is not injected.  Cardiovascular: Normal rate,  regular rhythm and normal heart sounds.  Respiratory: Effort normal and breath sounds normal. No accessory muscle usage. No tachypnea. No respiratory distress. He has no wheezes. He has no rhonchi. He has no rales. He exhibits no tenderness.  Moving air well in all lung fields. No increased work of breathing noted.   Lymphadenopathy:    He has no cervical adenopathy.  Neurological: He is alert.  Skin: No abrasion, no petechiae and no rash noted. Rash is not papular, not vesicular and not urticarial. No erythema. No pallor.  Psychiatric: He has a normal mood and affect.     Diagnostic studies: none   Salvatore Marvel, MD  [facility] [place]

## 2019-04-08 ENCOUNTER — Encounter: Payer: Self-pay | Admitting: Allergy & Immunology

## 2019-04-15 ENCOUNTER — Ambulatory Visit (INDEPENDENT_AMBULATORY_CARE_PROVIDER_SITE_OTHER): Payer: BC Managed Care – PPO | Admitting: *Deleted

## 2019-04-15 DIAGNOSIS — J309 Allergic rhinitis, unspecified: Secondary | ICD-10-CM

## 2019-04-20 ENCOUNTER — Ambulatory Visit (INDEPENDENT_AMBULATORY_CARE_PROVIDER_SITE_OTHER): Payer: BC Managed Care – PPO | Admitting: *Deleted

## 2019-04-20 ENCOUNTER — Other Ambulatory Visit: Payer: Self-pay | Admitting: Family Medicine

## 2019-04-20 DIAGNOSIS — J309 Allergic rhinitis, unspecified: Secondary | ICD-10-CM

## 2019-04-27 ENCOUNTER — Other Ambulatory Visit: Payer: Self-pay | Admitting: Family Medicine

## 2019-04-28 ENCOUNTER — Other Ambulatory Visit: Payer: Self-pay | Admitting: Family Medicine

## 2019-04-28 MED ORDER — AMPHETAMINE-DEXTROAMPHET ER 15 MG PO CP24: 15.0000 mg | ORAL_CAPSULE | ORAL | 0 refills | Status: DC

## 2019-04-28 MED ORDER — ESCITALOPRAM OXALATE 10 MG PO TABS: 10.0000 mg | ORAL_TABLET | Freq: Every day | ORAL | 0 refills | Status: DC

## 2019-05-04 ENCOUNTER — Ambulatory Visit (INDEPENDENT_AMBULATORY_CARE_PROVIDER_SITE_OTHER): Payer: BC Managed Care – PPO

## 2019-05-04 DIAGNOSIS — J309 Allergic rhinitis, unspecified: Secondary | ICD-10-CM | POA: Diagnosis not present

## 2019-05-06 ENCOUNTER — Other Ambulatory Visit: Payer: Self-pay

## 2019-05-06 ENCOUNTER — Telehealth (INDEPENDENT_AMBULATORY_CARE_PROVIDER_SITE_OTHER): Payer: BC Managed Care – PPO | Admitting: Family Medicine

## 2019-05-06 ENCOUNTER — Encounter: Payer: Self-pay | Admitting: Family Medicine

## 2019-05-06 VITALS — HR 52 | Wt 180.0 lb

## 2019-05-06 DIAGNOSIS — F320 Major depressive disorder, single episode, mild: Secondary | ICD-10-CM

## 2019-05-06 NOTE — Assessment & Plan Note (Signed)
Continues to see improvement with Lexapro 10mg . Not having problems with motivation to follow up on school work or go to class. Finding time to do hobbies and activities of interest and overall feels better.  - Cont current dose and follow up in 3 months. If symptoms are improved and he no longer feels depressed in 3 months could consider tapering off Lexapro.

## 2019-05-06 NOTE — Progress Notes (Signed)
[facility] Visit  Patient consented to have virtual visit. Method of visit: Video  Encounter participants: Patient: Nathaniel Soto - located at home in [facility] Provider: Nuala Alpha - located at [facility] Others (if applicable): none  Chief Complaint: F/u for Depression  HPI:  Patient 33y/o male with history of depression who presents for follow up. Lexapro did create increase in urination at night time. However, his urination at night time has improved. Overall, he feels like his depression is well controlled on Lexapro and he is continuing his graduate school studies. He is thinking about going into Scientist, water quality. He thinks the dose of Lexapro is good and does not wish to decrease the dose but he is not having any difficulty doing his work, going to class, or running errands so he want to keep the dose as is.  ROS: per HPI  Pertinent PMHx: Depression  Exam:  Respiratory: breathing normally, speaking in full sentences  Assessment/Plan:  Depression, major, single episode, mild ([facility]) Continues to see improvement with Lexapro 10mg . Not having problems with motivation to follow up on school work or go to class. Finding time to do hobbies and activities of interest and overall feels better.  - Cont current dose and follow up in 3 months. If symptoms are improved and he no longer feels depressed in 3 months could consider tapering off Lexapro.    Time spent during visit with patient: >20 minutes  Harolyn Rutherford, DO [facility], PGY-3

## 2019-05-16 ENCOUNTER — Ambulatory Visit (INDEPENDENT_AMBULATORY_CARE_PROVIDER_SITE_OTHER): Payer: BC Managed Care – PPO

## 2019-05-16 DIAGNOSIS — J309 Allergic rhinitis, unspecified: Secondary | ICD-10-CM

## 2019-05-25 ENCOUNTER — Other Ambulatory Visit: Payer: Self-pay | Admitting: Family Medicine

## 2019-05-26 MED ORDER — AMPHETAMINE-DEXTROAMPHET ER 15 MG PO CP24: 15.0000 mg | ORAL_CAPSULE | ORAL | 0 refills | Status: DC

## 2019-05-26 MED ORDER — ESCITALOPRAM OXALATE 10 MG PO TABS: 10.0000 mg | ORAL_TABLET | Freq: Every day | ORAL | 0 refills | Status: DC

## 2019-05-27 ENCOUNTER — Ambulatory Visit (INDEPENDENT_AMBULATORY_CARE_PROVIDER_SITE_OTHER): Payer: BC Managed Care – PPO

## 2019-05-27 DIAGNOSIS — J309 Allergic rhinitis, unspecified: Secondary | ICD-10-CM | POA: Diagnosis not present

## 2019-06-03 ENCOUNTER — Ambulatory Visit (INDEPENDENT_AMBULATORY_CARE_PROVIDER_SITE_OTHER): Payer: BC Managed Care – PPO

## 2019-06-03 DIAGNOSIS — J309 Allergic rhinitis, unspecified: Secondary | ICD-10-CM | POA: Diagnosis not present

## 2019-06-15 ENCOUNTER — Ambulatory Visit (INDEPENDENT_AMBULATORY_CARE_PROVIDER_SITE_OTHER): Payer: BC Managed Care – PPO | Admitting: *Deleted

## 2019-06-15 DIAGNOSIS — J309 Allergic rhinitis, unspecified: Secondary | ICD-10-CM | POA: Diagnosis not present

## 2019-06-20 ENCOUNTER — Ambulatory Visit (INDEPENDENT_AMBULATORY_CARE_PROVIDER_SITE_OTHER): Payer: BC Managed Care – PPO

## 2019-06-20 DIAGNOSIS — J309 Allergic rhinitis, unspecified: Secondary | ICD-10-CM

## 2019-06-22 ENCOUNTER — Other Ambulatory Visit: Payer: Self-pay | Admitting: Family Medicine

## 2019-06-22 MED ORDER — AMPHETAMINE-DEXTROAMPHET ER 15 MG PO CP24: 15.0000 mg | ORAL_CAPSULE | ORAL | 0 refills | Status: DC

## 2019-06-29 ENCOUNTER — Ambulatory Visit (INDEPENDENT_AMBULATORY_CARE_PROVIDER_SITE_OTHER): Payer: BC Managed Care – PPO

## 2019-06-29 DIAGNOSIS — J309 Allergic rhinitis, unspecified: Secondary | ICD-10-CM | POA: Diagnosis not present

## 2019-07-04 ENCOUNTER — Ambulatory Visit (INDEPENDENT_AMBULATORY_CARE_PROVIDER_SITE_OTHER): Payer: BC Managed Care – PPO

## 2019-07-04 DIAGNOSIS — J309 Allergic rhinitis, unspecified: Secondary | ICD-10-CM

## 2019-07-13 ENCOUNTER — Ambulatory Visit (INDEPENDENT_AMBULATORY_CARE_PROVIDER_SITE_OTHER): Payer: BC Managed Care – PPO

## 2019-07-13 DIAGNOSIS — J309 Allergic rhinitis, unspecified: Secondary | ICD-10-CM | POA: Diagnosis not present

## 2019-07-15 ENCOUNTER — Other Ambulatory Visit: Payer: Self-pay | Admitting: Family Medicine

## 2019-07-18 ENCOUNTER — Ambulatory Visit (INDEPENDENT_AMBULATORY_CARE_PROVIDER_SITE_OTHER): Payer: BC Managed Care – PPO

## 2019-07-18 DIAGNOSIS — J309 Allergic rhinitis, unspecified: Secondary | ICD-10-CM

## 2019-07-22 ENCOUNTER — Other Ambulatory Visit: Payer: Self-pay | Admitting: Family Medicine

## 2019-07-25 MED ORDER — AMPHETAMINE-DEXTROAMPHET ER 15 MG PO CP24: 15.0000 mg | ORAL_CAPSULE | ORAL | 0 refills | Status: DC

## 2019-07-27 ENCOUNTER — Ambulatory Visit (INDEPENDENT_AMBULATORY_CARE_PROVIDER_SITE_OTHER): Payer: BC Managed Care – PPO | Admitting: *Deleted

## 2019-07-27 DIAGNOSIS — J309 Allergic rhinitis, unspecified: Secondary | ICD-10-CM | POA: Diagnosis not present

## 2019-08-02 ENCOUNTER — Other Ambulatory Visit: Payer: Self-pay

## 2019-08-02 ENCOUNTER — Ambulatory Visit (INDEPENDENT_AMBULATORY_CARE_PROVIDER_SITE_OTHER): Payer: BC Managed Care – PPO

## 2019-08-02 ENCOUNTER — Ambulatory Visit: Payer: BC Managed Care – PPO | Admitting: Family Medicine

## 2019-08-02 ENCOUNTER — Ambulatory Visit
Admission: RE | Admit: 2019-08-02 | Discharge: 2019-08-02 | Disposition: A | Discharge status: Home / Self Care | Payer: BC Managed Care – PPO | Source: Ambulatory Visit | Attending: Family Medicine | Admitting: Family Medicine

## 2019-08-02 ENCOUNTER — Encounter: Payer: Self-pay | Admitting: Family Medicine

## 2019-08-02 VITALS — BP 105/55 | HR 58 | Wt 195.0 lb

## 2019-08-02 DIAGNOSIS — M25571 Pain in right ankle and joints of right foot: Secondary | ICD-10-CM

## 2019-08-02 DIAGNOSIS — J309 Allergic rhinitis, unspecified: Secondary | ICD-10-CM | POA: Diagnosis not present

## 2019-08-02 MED ORDER — MELOXICAM 15 MG PO TABS: 15.0000 mg | ORAL_TABLET | Freq: Every day | ORAL | 0 refills | Status: DC

## 2019-08-02 NOTE — Patient Instructions (Addendum)
It was a pleasure to see you today! Thank you for choosing [facility] for your primary care.  Nathaniel Soto was seen for an ankle injury likely due to irritation of the anterior talofibular ligament.   -Recommend purchasing an ankle compression sleeve to help with support your joint.  -continue your ankle exercises as tolerated  -I have prescribed a course of anti-inflammatory medications for the next few weeks. Please take one tablet once daily for the next 2 weeks and we will re-assess during your follow up visit.  - I will also order an ankle xray just to make sure there is no fracture. I will notify you of any abnormal results.   Please be aware if you start to have an upset stomach or notice blood in your bowel movements, please stop taking the Mobic and notify our office.     You should return to our clinic in 2 weeks for follow up with Dr. Rosita Fire.     Best Wishes,  Dr. Rosita Fire

## 2019-08-02 NOTE — Assessment & Plan Note (Addendum)
Right ankle pain that has been present for 1 month. Area of irritation appears to involve anterior fibular joint and is likely due to joint irritation, considering stress fracture due to hx of regular running for exercise.  -right ankle complete xray  -RX meloxicam 15mg  for 2 weeks  -recommend compression sleeve and continuing ankle exercises as tolerated  -follow up in two weeks  -if no improvement, will refer to sports medicine

## 2019-08-02 NOTE — Progress Notes (Signed)
° ° °  SUBJECTIVE:   CHIEF COMPLAINT / HPI: right ankle injury   Patient reports that is recently ran a marathon and is now having right ankle pain that has been bothering him since mid May. Patient reports the pain is 3-4/10 at its worst and at a baseline is achy/dull pain. Patient has tried taking ibuprofen, internal and external rotation exercises. He has not used an ankle brace or ice. Patient denies hearing any popping or noticing when exactly the injury occurred but states that he noticed the pain after completing his race. He denies any problems with ambulation.   PERTINENT  PMH / PSH: regular physical activity, MDD   OBJECTIVE:   BP (!) 105/55    Pulse (!) 58    Wt 195 lb (88.5 kg)    SpO2 98%    BMI 29.65 kg/m    General: male appearing stated age in no acute distress Cardio: Normal S1 and S2, no S3 or S4. Rhythm is regular. No murmurs or rubs.  Bilateral radial pulses palpable Pulm: Clear to auscultation bilaterally, no crackles, wheezing, or diminished breath sounds. Normal respiratory effort, stable on RA Extremities: left ankle without erythema or ecchymosis, no edema. Right ankle no edema, some point tenderness in area of ant talofibular joint, no ecchymosis, patient demonstrates normal weight transfer during ambulation, widened forefoot, No peripheral edema. Warm/ well perfused. No decreased sensation in bilateral ankles  Neuro: pt alert and oriented x4    ASSESSMENT/PLAN:   Ankle pain, right Right ankle pain that has been present for 1 month. Area of irritation appears to involve anterior fibular joint and is likely due to joint irritation, considering stress fracture due to hx of regular running for exercise.  -right ankle complete xray  -RX meloxicam 15mg  for 2 weeks  -recommend compression sleeve and continuing ankle exercises as tolerated  -follow up in two weeks  -if no improvement, will refer to sports medicine    F/u in 2 weeks   Stark Klein, MD [place]

## 2019-08-03 ENCOUNTER — Encounter: Payer: Self-pay | Admitting: Family Medicine

## 2019-08-10 ENCOUNTER — Ambulatory Visit (INDEPENDENT_AMBULATORY_CARE_PROVIDER_SITE_OTHER): Payer: BC Managed Care – PPO

## 2019-08-10 DIAGNOSIS — J309 Allergic rhinitis, unspecified: Secondary | ICD-10-CM | POA: Diagnosis not present

## 2019-08-16 ENCOUNTER — Ambulatory Visit (INDEPENDENT_AMBULATORY_CARE_PROVIDER_SITE_OTHER): Payer: BC Managed Care – PPO

## 2019-08-16 DIAGNOSIS — J309 Allergic rhinitis, unspecified: Secondary | ICD-10-CM | POA: Diagnosis not present

## 2019-08-24 ENCOUNTER — Other Ambulatory Visit: Payer: Self-pay | Admitting: Family Medicine

## 2019-08-24 ENCOUNTER — Ambulatory Visit (INDEPENDENT_AMBULATORY_CARE_PROVIDER_SITE_OTHER): Payer: BC Managed Care – PPO

## 2019-08-24 DIAGNOSIS — J309 Allergic rhinitis, unspecified: Secondary | ICD-10-CM

## 2019-08-24 NOTE — Progress Notes (Signed)
EXP 08/23/20

## 2019-08-25 DIAGNOSIS — J3089 Other allergic rhinitis: Secondary | ICD-10-CM

## 2019-08-26 ENCOUNTER — Other Ambulatory Visit: Payer: Self-pay | Admitting: Family Medicine

## 2019-08-26 MED ORDER — ESCITALOPRAM OXALATE 10 MG PO TABS: 10.0000 mg | ORAL_TABLET | Freq: Every day | ORAL | 0 refills | Status: DC

## 2019-08-26 MED ORDER — AMPHETAMINE-DEXTROAMPHET ER 15 MG PO CP24: 15.0000 mg | ORAL_CAPSULE | ORAL | 0 refills | Status: DC

## 2019-09-02 ENCOUNTER — Ambulatory Visit (INDEPENDENT_AMBULATORY_CARE_PROVIDER_SITE_OTHER): Payer: BC Managed Care – PPO

## 2019-09-02 DIAGNOSIS — J309 Allergic rhinitis, unspecified: Secondary | ICD-10-CM | POA: Diagnosis not present

## 2019-09-08 ENCOUNTER — Ambulatory Visit (INDEPENDENT_AMBULATORY_CARE_PROVIDER_SITE_OTHER): Payer: BC Managed Care – PPO

## 2019-09-08 DIAGNOSIS — J309 Allergic rhinitis, unspecified: Secondary | ICD-10-CM

## 2019-09-15 ENCOUNTER — Other Ambulatory Visit: Payer: Self-pay | Admitting: Family Medicine

## 2019-09-16 ENCOUNTER — Ambulatory Visit (INDEPENDENT_AMBULATORY_CARE_PROVIDER_SITE_OTHER): Payer: BC Managed Care – PPO

## 2019-09-16 DIAGNOSIS — J309 Allergic rhinitis, unspecified: Secondary | ICD-10-CM

## 2019-09-22 ENCOUNTER — Other Ambulatory Visit: Payer: Self-pay

## 2019-09-22 ENCOUNTER — Ambulatory Visit (INDEPENDENT_AMBULATORY_CARE_PROVIDER_SITE_OTHER): Payer: BC Managed Care – PPO

## 2019-09-22 DIAGNOSIS — J309 Allergic rhinitis, unspecified: Secondary | ICD-10-CM | POA: Diagnosis not present

## 2019-09-27 ENCOUNTER — Ambulatory Visit (INDEPENDENT_AMBULATORY_CARE_PROVIDER_SITE_OTHER): Payer: BC Managed Care – PPO

## 2019-09-27 DIAGNOSIS — J309 Allergic rhinitis, unspecified: Secondary | ICD-10-CM

## 2019-10-03 ENCOUNTER — Telehealth: Payer: Self-pay

## 2019-10-03 ENCOUNTER — Other Ambulatory Visit: Payer: Self-pay | Admitting: Family Medicine

## 2019-10-03 DIAGNOSIS — F909 Attention-deficit hyperactivity disorder, unspecified type: Secondary | ICD-10-CM

## 2019-10-03 MED ORDER — AMPHETAMINE-DEXTROAMPHET ER 15 MG PO CP24: 15.0000 mg | ORAL_CAPSULE | ORAL | 0 refills | Status: DC

## 2019-10-03 NOTE — Telephone Encounter (Signed)
Patient calls nurse line requesting a refill on Adderall. I see where this was refused due to needing an apt. Patient scheduled with PCP for 9/16. Patient is hopeful to get a refill to last him until apt. Please advise.

## 2019-10-06 ENCOUNTER — Ambulatory Visit (INDEPENDENT_AMBULATORY_CARE_PROVIDER_SITE_OTHER): Payer: BC Managed Care – PPO

## 2019-10-06 DIAGNOSIS — J309 Allergic rhinitis, unspecified: Secondary | ICD-10-CM

## 2019-10-14 ENCOUNTER — Ambulatory Visit (INDEPENDENT_AMBULATORY_CARE_PROVIDER_SITE_OTHER): Payer: BC Managed Care – PPO | Admitting: *Deleted

## 2019-10-14 DIAGNOSIS — J309 Allergic rhinitis, unspecified: Secondary | ICD-10-CM | POA: Diagnosis not present

## 2019-10-20 ENCOUNTER — Ambulatory Visit (INDEPENDENT_AMBULATORY_CARE_PROVIDER_SITE_OTHER): Payer: BC Managed Care – PPO

## 2019-10-20 DIAGNOSIS — J309 Allergic rhinitis, unspecified: Secondary | ICD-10-CM | POA: Diagnosis not present

## 2019-10-27 ENCOUNTER — Ambulatory Visit: Payer: BC Managed Care – PPO | Admitting: Family Medicine

## 2019-10-27 ENCOUNTER — Encounter: Payer: Self-pay | Admitting: Family Medicine

## 2019-10-27 ENCOUNTER — Ambulatory Visit (INDEPENDENT_AMBULATORY_CARE_PROVIDER_SITE_OTHER): Payer: BC Managed Care – PPO | Admitting: *Deleted

## 2019-10-27 ENCOUNTER — Other Ambulatory Visit: Payer: Self-pay

## 2019-10-27 DIAGNOSIS — J309 Allergic rhinitis, unspecified: Secondary | ICD-10-CM

## 2019-10-27 DIAGNOSIS — F909 Attention-deficit hyperactivity disorder, unspecified type: Secondary | ICD-10-CM | POA: Diagnosis not present

## 2019-10-27 MED ORDER — AMPHETAMINE-DEXTROAMPHET ER 20 MG PO CP24: 20.0000 mg | ORAL_CAPSULE | ORAL | 0 refills | Status: DC

## 2019-10-27 NOTE — Progress Notes (Signed)
    SUBJECTIVE:   CHIEF COMPLAINT / HPI:   Medication Refill Patient presents for refill on his Adderall XR 15mg . Patient has been on this medication for 10 years although he temporarily stopped it in the past while not in school and when he didn't have insurance.  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 He denies any side effects including appetite suppression, sleep changes, dry mouth, depressed mood, or other symptoms. Expresses interest in trying a slightly higher dose because he's still having some difficulty with focus at school.   Of note, briefly tried Vyvanse in the past but did not tolerate well.  Flu Shot Patient would like to receive his flu shot, however he did receive an injection at his allergist earlier today and is unsure if he's able to get the flu shot on the same day.  PERTINENT  PMH / PSH: ADHD, Depression, Allergic Rhinitis  OBJECTIVE:   BP (!) 98/58   Pulse 80   Ht 5\' 8"  (1.727 m)   Wt 202 lb 6.4 oz (91.8 kg)   SpO2 97%   BMI 30.77 kg/m   Gen: alert, well-appearing, NAD Cardiac: RRR, normal S1/S2 without m/r/g Lungs: normal WOB, lungs CTAB Abd: soft, nontender, nondistended Psych: appropriate affect, normal speech, normal thought content, denies SI/HI  ASSESSMENT/PLAN:   Medication Refill/ADHD Stable. Patient feels he could benefit from a slight dose increase. -Patient increased from 15mg  Adderall XR daily to 20mg  daily -Refills provided  Flu Shot Patient unable to obtain flu vaccine within 48 hours of allergy shots. He will obtain at his earliest convenience, likely at [facility].   Discussed case with Dr. Ardelia Mems.  Alcus Dad, MD [place]

## 2019-10-27 NOTE — Patient Instructions (Signed)
It was great to meet you!  Our plans for today:  - We have increased your Adderall XR to 20mg   Please call if this is causing any undesired side effects.   Dr. [facility]

## 2019-11-04 ENCOUNTER — Ambulatory Visit (INDEPENDENT_AMBULATORY_CARE_PROVIDER_SITE_OTHER): Payer: BC Managed Care – PPO

## 2019-11-04 DIAGNOSIS — J309 Allergic rhinitis, unspecified: Secondary | ICD-10-CM

## 2019-11-14 ENCOUNTER — Ambulatory Visit (INDEPENDENT_AMBULATORY_CARE_PROVIDER_SITE_OTHER): Payer: BC Managed Care – PPO

## 2019-11-14 DIAGNOSIS — J309 Allergic rhinitis, unspecified: Secondary | ICD-10-CM | POA: Diagnosis not present

## 2019-11-16 DIAGNOSIS — J3089 Other allergic rhinitis: Secondary | ICD-10-CM | POA: Diagnosis not present

## 2019-11-16 NOTE — Progress Notes (Signed)
VIALS EXP 11-15-20 

## 2019-11-20 ENCOUNTER — Other Ambulatory Visit: Payer: Self-pay | Admitting: Family Medicine

## 2019-11-22 ENCOUNTER — Ambulatory Visit (INDEPENDENT_AMBULATORY_CARE_PROVIDER_SITE_OTHER): Payer: BC Managed Care – PPO

## 2019-11-22 DIAGNOSIS — J309 Allergic rhinitis, unspecified: Secondary | ICD-10-CM

## 2019-11-29 ENCOUNTER — Encounter: Payer: Self-pay | Admitting: Family Medicine

## 2019-11-29 DIAGNOSIS — F909 Attention-deficit hyperactivity disorder, unspecified type: Secondary | ICD-10-CM

## 2019-11-29 DIAGNOSIS — F902 Attention-deficit hyperactivity disorder, combined type: Secondary | ICD-10-CM | POA: Diagnosis not present

## 2019-11-29 DIAGNOSIS — Z630 Problems in relationship with spouse or partner: Secondary | ICD-10-CM | POA: Diagnosis not present

## 2019-11-30 MED ORDER — AMPHETAMINE-DEXTROAMPHET ER 20 MG PO CP24: 20.0000 mg | ORAL_CAPSULE | ORAL | 0 refills | Status: DC

## 2019-12-01 ENCOUNTER — Ambulatory Visit (INDEPENDENT_AMBULATORY_CARE_PROVIDER_SITE_OTHER): Payer: BC Managed Care – PPO | Admitting: *Deleted

## 2019-12-01 DIAGNOSIS — J309 Allergic rhinitis, unspecified: Secondary | ICD-10-CM

## 2019-12-06 ENCOUNTER — Ambulatory Visit (INDEPENDENT_AMBULATORY_CARE_PROVIDER_SITE_OTHER): Payer: BC Managed Care – PPO

## 2019-12-06 DIAGNOSIS — J309 Allergic rhinitis, unspecified: Secondary | ICD-10-CM

## 2019-12-12 ENCOUNTER — Ambulatory Visit (INDEPENDENT_AMBULATORY_CARE_PROVIDER_SITE_OTHER): Payer: BC Managed Care – PPO

## 2019-12-12 DIAGNOSIS — J309 Allergic rhinitis, unspecified: Secondary | ICD-10-CM | POA: Diagnosis not present

## 2019-12-19 ENCOUNTER — Ambulatory Visit (INDEPENDENT_AMBULATORY_CARE_PROVIDER_SITE_OTHER): Payer: BC Managed Care – PPO | Admitting: *Deleted

## 2019-12-19 DIAGNOSIS — J309 Allergic rhinitis, unspecified: Secondary | ICD-10-CM | POA: Diagnosis not present

## 2019-12-22 DIAGNOSIS — Z630 Problems in relationship with spouse or partner: Secondary | ICD-10-CM | POA: Diagnosis not present

## 2019-12-22 DIAGNOSIS — F902 Attention-deficit hyperactivity disorder, combined type: Secondary | ICD-10-CM | POA: Diagnosis not present

## 2019-12-26 ENCOUNTER — Ambulatory Visit (INDEPENDENT_AMBULATORY_CARE_PROVIDER_SITE_OTHER): Payer: BC Managed Care – PPO

## 2019-12-26 DIAGNOSIS — J309 Allergic rhinitis, unspecified: Secondary | ICD-10-CM

## 2020-01-02 ENCOUNTER — Other Ambulatory Visit: Payer: Self-pay

## 2020-01-02 DIAGNOSIS — F909 Attention-deficit hyperactivity disorder, unspecified type: Secondary | ICD-10-CM

## 2020-01-02 MED ORDER — AMPHETAMINE-DEXTROAMPHET ER 20 MG PO CP24: 20.0000 mg | ORAL_CAPSULE | ORAL | 0 refills | Status: DC

## 2020-01-03 ENCOUNTER — Ambulatory Visit (INDEPENDENT_AMBULATORY_CARE_PROVIDER_SITE_OTHER): Payer: BC Managed Care – PPO | Admitting: *Deleted

## 2020-01-03 DIAGNOSIS — J309 Allergic rhinitis, unspecified: Secondary | ICD-10-CM | POA: Diagnosis not present

## 2020-01-10 DIAGNOSIS — F902 Attention-deficit hyperactivity disorder, combined type: Secondary | ICD-10-CM | POA: Diagnosis not present

## 2020-01-10 DIAGNOSIS — Z630 Problems in relationship with spouse or partner: Secondary | ICD-10-CM | POA: Diagnosis not present

## 2020-01-12 ENCOUNTER — Ambulatory Visit (INDEPENDENT_AMBULATORY_CARE_PROVIDER_SITE_OTHER): Payer: BC Managed Care – PPO

## 2020-01-12 DIAGNOSIS — J309 Allergic rhinitis, unspecified: Secondary | ICD-10-CM

## 2020-01-14 ENCOUNTER — Other Ambulatory Visit: Payer: Self-pay | Admitting: Family Medicine

## 2020-01-19 ENCOUNTER — Ambulatory Visit (INDEPENDENT_AMBULATORY_CARE_PROVIDER_SITE_OTHER): Payer: BC Managed Care – PPO

## 2020-01-19 DIAGNOSIS — J309 Allergic rhinitis, unspecified: Secondary | ICD-10-CM | POA: Diagnosis not present

## 2020-01-26 ENCOUNTER — Other Ambulatory Visit: Payer: Self-pay

## 2020-01-26 DIAGNOSIS — F909 Attention-deficit hyperactivity disorder, unspecified type: Secondary | ICD-10-CM

## 2020-01-26 MED ORDER — AMPHETAMINE-DEXTROAMPHET ER 20 MG PO CP24: 20.0000 mg | ORAL_CAPSULE | ORAL | 0 refills | Status: DC

## 2020-01-30 ENCOUNTER — Ambulatory Visit (INDEPENDENT_AMBULATORY_CARE_PROVIDER_SITE_OTHER): Payer: BC Managed Care – PPO

## 2020-01-30 DIAGNOSIS — J309 Allergic rhinitis, unspecified: Secondary | ICD-10-CM | POA: Diagnosis not present

## 2020-02-01 DIAGNOSIS — F902 Attention-deficit hyperactivity disorder, combined type: Secondary | ICD-10-CM | POA: Diagnosis not present

## 2020-02-01 DIAGNOSIS — Z630 Problems in relationship with spouse or partner: Secondary | ICD-10-CM | POA: Diagnosis not present

## 2020-02-16 ENCOUNTER — Other Ambulatory Visit: Payer: Self-pay | Admitting: Family Medicine

## 2020-02-16 ENCOUNTER — Ambulatory Visit (INDEPENDENT_AMBULATORY_CARE_PROVIDER_SITE_OTHER): Payer: BC Managed Care – PPO

## 2020-02-16 DIAGNOSIS — J309 Allergic rhinitis, unspecified: Secondary | ICD-10-CM

## 2020-02-21 DIAGNOSIS — F902 Attention-deficit hyperactivity disorder, combined type: Secondary | ICD-10-CM | POA: Diagnosis not present

## 2020-02-21 DIAGNOSIS — Z630 Problems in relationship with spouse or partner: Secondary | ICD-10-CM | POA: Diagnosis not present

## 2020-02-28 DIAGNOSIS — J3089 Other allergic rhinitis: Secondary | ICD-10-CM | POA: Diagnosis not present

## 2020-02-28 NOTE — Progress Notes (Signed)
VIALS EXP 02-27-21 

## 2020-03-02 ENCOUNTER — Ambulatory Visit (INDEPENDENT_AMBULATORY_CARE_PROVIDER_SITE_OTHER): Payer: BC Managed Care – PPO | Admitting: *Deleted

## 2020-03-02 DIAGNOSIS — J309 Allergic rhinitis, unspecified: Secondary | ICD-10-CM

## 2020-03-14 DIAGNOSIS — F902 Attention-deficit hyperactivity disorder, combined type: Secondary | ICD-10-CM | POA: Diagnosis not present

## 2020-03-14 DIAGNOSIS — Z630 Problems in relationship with spouse or partner: Secondary | ICD-10-CM | POA: Diagnosis not present

## 2020-03-20 ENCOUNTER — Ambulatory Visit (INDEPENDENT_AMBULATORY_CARE_PROVIDER_SITE_OTHER): Payer: BC Managed Care – PPO | Admitting: *Deleted

## 2020-03-20 DIAGNOSIS — J309 Allergic rhinitis, unspecified: Secondary | ICD-10-CM | POA: Diagnosis not present

## 2020-04-03 DIAGNOSIS — F902 Attention-deficit hyperactivity disorder, combined type: Secondary | ICD-10-CM | POA: Diagnosis not present

## 2020-04-03 DIAGNOSIS — Z630 Problems in relationship with spouse or partner: Secondary | ICD-10-CM | POA: Diagnosis not present

## 2020-04-04 ENCOUNTER — Ambulatory Visit (INDEPENDENT_AMBULATORY_CARE_PROVIDER_SITE_OTHER): Payer: BC Managed Care – PPO

## 2020-04-04 DIAGNOSIS — J309 Allergic rhinitis, unspecified: Secondary | ICD-10-CM

## 2020-04-10 ENCOUNTER — Ambulatory Visit (INDEPENDENT_AMBULATORY_CARE_PROVIDER_SITE_OTHER): Payer: BC Managed Care – PPO

## 2020-04-10 DIAGNOSIS — J309 Allergic rhinitis, unspecified: Secondary | ICD-10-CM | POA: Diagnosis not present

## 2020-04-12 DIAGNOSIS — F4322 Adjustment disorder with anxiety: Secondary | ICD-10-CM | POA: Diagnosis not present

## 2020-04-19 ENCOUNTER — Ambulatory Visit (INDEPENDENT_AMBULATORY_CARE_PROVIDER_SITE_OTHER): Payer: BC Managed Care – PPO

## 2020-04-19 DIAGNOSIS — J309 Allergic rhinitis, unspecified: Secondary | ICD-10-CM

## 2020-04-25 DIAGNOSIS — F902 Attention-deficit hyperactivity disorder, combined type: Secondary | ICD-10-CM | POA: Diagnosis not present

## 2020-04-25 DIAGNOSIS — Z630 Problems in relationship with spouse or partner: Secondary | ICD-10-CM | POA: Diagnosis not present

## 2020-04-26 DIAGNOSIS — F4322 Adjustment disorder with anxiety: Secondary | ICD-10-CM | POA: Diagnosis not present

## 2020-04-27 ENCOUNTER — Other Ambulatory Visit: Payer: Self-pay

## 2020-04-27 ENCOUNTER — Ambulatory Visit (INDEPENDENT_AMBULATORY_CARE_PROVIDER_SITE_OTHER): Payer: BC Managed Care – PPO | Admitting: *Deleted

## 2020-04-27 DIAGNOSIS — F909 Attention-deficit hyperactivity disorder, unspecified type: Secondary | ICD-10-CM

## 2020-04-27 DIAGNOSIS — J309 Allergic rhinitis, unspecified: Secondary | ICD-10-CM | POA: Diagnosis not present

## 2020-04-28 MED ORDER — AMPHETAMINE-DEXTROAMPHET ER 20 MG PO CP24: 20.0000 mg | ORAL_CAPSULE | ORAL | 0 refills | Status: DC

## 2020-05-08 ENCOUNTER — Ambulatory Visit (INDEPENDENT_AMBULATORY_CARE_PROVIDER_SITE_OTHER): Payer: BC Managed Care – PPO | Admitting: *Deleted

## 2020-05-08 DIAGNOSIS — J309 Allergic rhinitis, unspecified: Secondary | ICD-10-CM

## 2020-05-10 DIAGNOSIS — F4322 Adjustment disorder with anxiety: Secondary | ICD-10-CM | POA: Diagnosis not present

## 2020-05-24 DIAGNOSIS — F4322 Adjustment disorder with anxiety: Secondary | ICD-10-CM | POA: Diagnosis not present

## 2020-05-29 ENCOUNTER — Ambulatory Visit (INDEPENDENT_AMBULATORY_CARE_PROVIDER_SITE_OTHER): Payer: BC Managed Care – PPO | Admitting: *Deleted

## 2020-05-29 ENCOUNTER — Other Ambulatory Visit: Payer: Self-pay

## 2020-05-29 DIAGNOSIS — J309 Allergic rhinitis, unspecified: Secondary | ICD-10-CM | POA: Diagnosis not present

## 2020-05-29 DIAGNOSIS — F909 Attention-deficit hyperactivity disorder, unspecified type: Secondary | ICD-10-CM

## 2020-05-30 MED ORDER — AMPHETAMINE-DEXTROAMPHET ER 20 MG PO CP24: 20.0000 mg | ORAL_CAPSULE | ORAL | 0 refills | Status: DC

## 2020-06-07 DIAGNOSIS — Z630 Problems in relationship with spouse or partner: Secondary | ICD-10-CM | POA: Diagnosis not present

## 2020-06-07 DIAGNOSIS — F4322 Adjustment disorder with anxiety: Secondary | ICD-10-CM | POA: Diagnosis not present

## 2020-06-07 DIAGNOSIS — F902 Attention-deficit hyperactivity disorder, combined type: Secondary | ICD-10-CM | POA: Diagnosis not present

## 2020-06-12 DIAGNOSIS — Z630 Problems in relationship with spouse or partner: Secondary | ICD-10-CM | POA: Diagnosis not present

## 2020-06-12 DIAGNOSIS — F902 Attention-deficit hyperactivity disorder, combined type: Secondary | ICD-10-CM | POA: Diagnosis not present

## 2020-06-20 DIAGNOSIS — Z630 Problems in relationship with spouse or partner: Secondary | ICD-10-CM | POA: Diagnosis not present

## 2020-06-20 DIAGNOSIS — F902 Attention-deficit hyperactivity disorder, combined type: Secondary | ICD-10-CM | POA: Diagnosis not present

## 2020-06-21 DIAGNOSIS — F4322 Adjustment disorder with anxiety: Secondary | ICD-10-CM | POA: Diagnosis not present

## 2020-06-26 ENCOUNTER — Ambulatory Visit (INDEPENDENT_AMBULATORY_CARE_PROVIDER_SITE_OTHER): Payer: BC Managed Care – PPO | Admitting: *Deleted

## 2020-06-26 DIAGNOSIS — J309 Allergic rhinitis, unspecified: Secondary | ICD-10-CM | POA: Diagnosis not present

## 2020-07-04 DIAGNOSIS — Z630 Problems in relationship with spouse or partner: Secondary | ICD-10-CM | POA: Diagnosis not present

## 2020-07-04 DIAGNOSIS — F902 Attention-deficit hyperactivity disorder, combined type: Secondary | ICD-10-CM | POA: Diagnosis not present

## 2020-07-05 DIAGNOSIS — F4322 Adjustment disorder with anxiety: Secondary | ICD-10-CM | POA: Diagnosis not present

## 2020-07-16 ENCOUNTER — Other Ambulatory Visit: Payer: Self-pay

## 2020-07-16 DIAGNOSIS — F909 Attention-deficit hyperactivity disorder, unspecified type: Secondary | ICD-10-CM

## 2020-07-16 MED ORDER — AMPHETAMINE-DEXTROAMPHET ER 20 MG PO CP24: 20.0000 mg | ORAL_CAPSULE | ORAL | 0 refills | Status: DC

## 2020-07-19 ENCOUNTER — Ambulatory Visit (INDEPENDENT_AMBULATORY_CARE_PROVIDER_SITE_OTHER): Payer: BC Managed Care – PPO | Admitting: *Deleted

## 2020-07-19 DIAGNOSIS — J309 Allergic rhinitis, unspecified: Secondary | ICD-10-CM | POA: Diagnosis not present

## 2020-07-23 NOTE — Progress Notes (Signed)
EXP  07/25/21 

## 2020-07-25 DIAGNOSIS — J3081 Allergic rhinitis due to animal (cat) (dog) hair and dander: Secondary | ICD-10-CM | POA: Diagnosis not present

## 2020-08-01 DIAGNOSIS — F902 Attention-deficit hyperactivity disorder, combined type: Secondary | ICD-10-CM | POA: Diagnosis not present

## 2020-08-01 DIAGNOSIS — Z630 Problems in relationship with spouse or partner: Secondary | ICD-10-CM | POA: Diagnosis not present

## 2020-08-02 ENCOUNTER — Ambulatory Visit (INDEPENDENT_AMBULATORY_CARE_PROVIDER_SITE_OTHER): Payer: BC Managed Care – PPO | Admitting: *Deleted

## 2020-08-02 DIAGNOSIS — J309 Allergic rhinitis, unspecified: Secondary | ICD-10-CM | POA: Diagnosis not present

## 2020-08-02 DIAGNOSIS — F4323 Adjustment disorder with mixed anxiety and depressed mood: Secondary | ICD-10-CM | POA: Diagnosis not present

## 2020-08-08 NOTE — Progress Notes (Signed)
[place] [address] Dept: [phone]  FOLLOW UP NOTE  Patient ID: Nathaniel Soto, male    DOB: [birth year]  Age: 35 y.o. MRN: [id number] Date of Office Visit: 08/09/2020  Assessment  Chief Complaint: Allergic Rhinitis  (Stuffy runny nose, headaches, congestion, and coughing. Medication has been helping )  HPI Nathaniel Soto is a 35 year old male who presents the clinic for follow-up visit.  He was last seen in this clinic on 08/05/2019 by Dr. Ernst Bowler for evaluation of allergic rhinitis with nasal congestion.  At today's visit, he reports allergic rhinitis has been much more controlled since his last visit to this clinic.  He does report occasional headache, nasal congestion and postnasal drainage with frequent throat clearing for which he continues Flonase daily and nasal saline rinses occasionally with relief of symptoms.  He has used azelastine in the past, however, is not currently using this medication.  He continues allergen immunotherapy with occasional itching and redness at the injection site.  He does take Allegra 180 mg twice on the day he receives his injection.  He reports a significant decrease in his symptoms of allergic rhinitis while continuing on allergen immunotherapy. His current medications are listed in the chart.    Drug Allergies:  No Known Allergies  Physical Exam: BP 122/78   Pulse 62   Temp 97.9 F (36.6 C)   Resp 18   Ht 5' 7.5" (1.715 m)   Wt 225 lb (102.1 kg)   SpO2 97%   BMI 34.72 kg/m    Physical Exam Vitals reviewed.  Constitutional:      Appearance: Normal appearance.  HENT:     Head: Normocephalic and atraumatic.     Right Ear: Tympanic membrane normal.     Left Ear: Tympanic membrane normal.     Nose:     Comments: Bilateral nares normal.  Pharynx normal.  Ears normal.  Eyes normal.    Mouth/Throat:     Pharynx: Oropharynx is clear.  Eyes:     Conjunctiva/sclera: Conjunctivae normal.  Cardiovascular:      Rate and Rhythm: Normal rate and regular rhythm.     Heart sounds: Normal heart sounds. No murmur heard. Pulmonary:     Effort: Pulmonary effort is normal.     Breath sounds: Normal breath sounds.     Comments: Lungs clear to auscultation Musculoskeletal:        General: Normal range of motion.     Cervical back: Normal range of motion and neck supple.  Skin:    General: Skin is warm and dry.  Neurological:     Mental Status: He is alert and oriented to person, place, and time.  Psychiatric:        Mood and Affect: Mood normal.        Behavior: Behavior normal.        Thought Content: Thought content normal.        Judgment: Judgment normal.    Assessment and Plan: 1. Seasonal and perennial allergic rhinitis     Meds ordered this encounter  Medications   AUVI-Q 0.3 MG/0.3ML SOAJ injection    Sig: Inject 0.3 mg into the muscle as needed for anaphylaxis.    Dispense:  2 each    Refill:  1     Patient Instructions  Allergic rhinitis/nasal congestion Continue allergen avoidance measures directed toward dog, cat, ragweed, dust mite, and cockroach as listed below Continue allergen immunotherapy once every 2 weeks and have access to  an epinephrine autoinjector set Continue Allegra 180 mg once a day as needed for runny nose or itch. Remember to rotate to a different antihistamine about every 3 months. Some examples of over the counter antihistamines include Zyrtec (cetirizine), Xyzal (levocetirizine), Allegra (fexofenadine), and Claritin (loratidine).  Continue Flonase 2 sprays in each nostril once a day as needed for a stuffy nose Consider saline nasal rinses as needed for nasal symptoms. Use this before any medicated nasal sprays for best result To manage injection site redness and itch, you may take Allegra 180 mg twice a day on the day before and the day of your injection as needed  Call the clinic if this treatment plan is not working well for you  Follow up in 1 year or  sooner if needed.  Return in about 1 year (around 08/09/2021), or if symptoms worsen or fail to improve.    Thank you for the opportunity to care for this patient.  Please do not hesitate to contact me with questions.  Gareth Morgan, FNP [facility] [place]

## 2020-08-08 NOTE — Patient Instructions (Addendum)
Allergic rhinitis/nasal congestion Continue allergen avoidance measures directed toward dog, cat, ragweed, dust mite, and cockroach as listed below Continue allergen immunotherapy once every 2 weeks and have access to an epinephrine autoinjector set Continue Allegra 180 mg once a day as needed for runny nose or itch. Remember to rotate to a different antihistamine about every 3 months. Some examples of over the counter antihistamines include Zyrtec (cetirizine), Xyzal (levocetirizine), Allegra (fexofenadine), and Claritin (loratidine).  Continue Flonase 2 sprays in each nostril once a day as needed for a stuffy nose Consider saline nasal rinses as needed for nasal symptoms. Use this before any medicated nasal sprays for best result To manage injection site redness and itch, you may take Allegra 180 mg twice a day on the day before and the day of your injection as needed  Call the clinic if this treatment plan is not working well for you  Follow up in 1 year or sooner if needed.

## 2020-08-09 ENCOUNTER — Other Ambulatory Visit: Payer: Self-pay

## 2020-08-09 ENCOUNTER — Ambulatory Visit: Payer: BC Managed Care – PPO | Admitting: Family Medicine

## 2020-08-09 ENCOUNTER — Encounter: Payer: Self-pay | Admitting: Family Medicine

## 2020-08-09 VITALS — BP 122/78 | HR 62 | Temp 97.9°F | Resp 18 | Ht 67.5 in | Wt 225.0 lb

## 2020-08-09 DIAGNOSIS — J302 Other seasonal allergic rhinitis: Secondary | ICD-10-CM

## 2020-08-09 DIAGNOSIS — J3089 Other allergic rhinitis: Secondary | ICD-10-CM | POA: Diagnosis not present

## 2020-08-09 MED ORDER — AUVI-Q 0.3 MG/0.3ML IJ SOAJ: 0.3000 mg | INTRAMUSCULAR | 1 refills | Status: DC | PRN

## 2020-08-15 DIAGNOSIS — Z630 Problems in relationship with spouse or partner: Secondary | ICD-10-CM | POA: Diagnosis not present

## 2020-08-15 DIAGNOSIS — F902 Attention-deficit hyperactivity disorder, combined type: Secondary | ICD-10-CM | POA: Diagnosis not present

## 2020-08-16 ENCOUNTER — Ambulatory Visit (INDEPENDENT_AMBULATORY_CARE_PROVIDER_SITE_OTHER): Payer: BC Managed Care – PPO | Admitting: *Deleted

## 2020-08-16 DIAGNOSIS — J309 Allergic rhinitis, unspecified: Secondary | ICD-10-CM

## 2020-08-21 DIAGNOSIS — F4322 Adjustment disorder with anxiety: Secondary | ICD-10-CM | POA: Diagnosis not present

## 2020-08-25 ENCOUNTER — Other Ambulatory Visit: Payer: Self-pay | Admitting: Family Medicine

## 2020-08-29 ENCOUNTER — Other Ambulatory Visit: Payer: Self-pay

## 2020-08-29 DIAGNOSIS — F909 Attention-deficit hyperactivity disorder, unspecified type: Secondary | ICD-10-CM

## 2020-08-30 ENCOUNTER — Ambulatory Visit (INDEPENDENT_AMBULATORY_CARE_PROVIDER_SITE_OTHER): Payer: BC Managed Care – PPO | Admitting: *Deleted

## 2020-08-30 DIAGNOSIS — J309 Allergic rhinitis, unspecified: Secondary | ICD-10-CM

## 2020-08-30 MED ORDER — AMPHETAMINE-DEXTROAMPHET ER 20 MG PO CP24: 20.0000 mg | ORAL_CAPSULE | ORAL | 0 refills | Status: DC

## 2020-09-13 ENCOUNTER — Ambulatory Visit (INDEPENDENT_AMBULATORY_CARE_PROVIDER_SITE_OTHER): Payer: BC Managed Care – PPO

## 2020-09-13 DIAGNOSIS — J309 Allergic rhinitis, unspecified: Secondary | ICD-10-CM

## 2020-09-18 ENCOUNTER — Ambulatory Visit (INDEPENDENT_AMBULATORY_CARE_PROVIDER_SITE_OTHER): Payer: BC Managed Care – PPO | Admitting: *Deleted

## 2020-09-18 DIAGNOSIS — J309 Allergic rhinitis, unspecified: Secondary | ICD-10-CM

## 2020-09-27 ENCOUNTER — Ambulatory Visit (INDEPENDENT_AMBULATORY_CARE_PROVIDER_SITE_OTHER): Payer: BC Managed Care – PPO | Admitting: *Deleted

## 2020-09-27 DIAGNOSIS — J309 Allergic rhinitis, unspecified: Secondary | ICD-10-CM

## 2020-10-01 ENCOUNTER — Ambulatory Visit (INDEPENDENT_AMBULATORY_CARE_PROVIDER_SITE_OTHER): Payer: BC Managed Care – PPO | Admitting: *Deleted

## 2020-10-01 DIAGNOSIS — J309 Allergic rhinitis, unspecified: Secondary | ICD-10-CM | POA: Diagnosis not present

## 2020-10-11 ENCOUNTER — Ambulatory Visit (INDEPENDENT_AMBULATORY_CARE_PROVIDER_SITE_OTHER): Payer: BC Managed Care – PPO | Admitting: *Deleted

## 2020-10-11 DIAGNOSIS — J309 Allergic rhinitis, unspecified: Secondary | ICD-10-CM | POA: Diagnosis not present

## 2020-10-11 DIAGNOSIS — F4322 Adjustment disorder with anxiety: Secondary | ICD-10-CM | POA: Diagnosis not present

## 2020-10-16 ENCOUNTER — Other Ambulatory Visit: Payer: Self-pay

## 2020-10-16 DIAGNOSIS — F909 Attention-deficit hyperactivity disorder, unspecified type: Secondary | ICD-10-CM

## 2020-10-17 MED ORDER — AMPHETAMINE-DEXTROAMPHET ER 20 MG PO CP24: 20.0000 mg | ORAL_CAPSULE | ORAL | 0 refills | Status: DC

## 2020-10-24 DIAGNOSIS — F902 Attention-deficit hyperactivity disorder, combined type: Secondary | ICD-10-CM | POA: Diagnosis not present

## 2020-10-24 DIAGNOSIS — F909 Attention-deficit hyperactivity disorder, unspecified type: Secondary | ICD-10-CM | POA: Diagnosis not present

## 2020-10-24 DIAGNOSIS — Z630 Problems in relationship with spouse or partner: Secondary | ICD-10-CM | POA: Diagnosis not present

## 2020-10-25 DIAGNOSIS — F4322 Adjustment disorder with anxiety: Secondary | ICD-10-CM | POA: Diagnosis not present

## 2020-10-29 ENCOUNTER — Ambulatory Visit (INDEPENDENT_AMBULATORY_CARE_PROVIDER_SITE_OTHER): Payer: BC Managed Care – PPO | Admitting: *Deleted

## 2020-10-29 DIAGNOSIS — J309 Allergic rhinitis, unspecified: Secondary | ICD-10-CM

## 2020-11-01 DIAGNOSIS — Z13228 Encounter for screening for other metabolic disorders: Secondary | ICD-10-CM | POA: Diagnosis not present

## 2020-11-01 DIAGNOSIS — E669 Obesity, unspecified: Secondary | ICD-10-CM | POA: Diagnosis not present

## 2020-11-01 DIAGNOSIS — Z131 Encounter for screening for diabetes mellitus: Secondary | ICD-10-CM | POA: Diagnosis not present

## 2020-11-01 DIAGNOSIS — Z1329 Encounter for screening for other suspected endocrine disorder: Secondary | ICD-10-CM | POA: Diagnosis not present

## 2020-11-02 DIAGNOSIS — F909 Attention-deficit hyperactivity disorder, unspecified type: Secondary | ICD-10-CM | POA: Diagnosis not present

## 2020-11-08 DIAGNOSIS — F4322 Adjustment disorder with anxiety: Secondary | ICD-10-CM | POA: Diagnosis not present

## 2020-11-12 DIAGNOSIS — F902 Attention-deficit hyperactivity disorder, combined type: Secondary | ICD-10-CM | POA: Diagnosis not present

## 2020-11-12 DIAGNOSIS — F909 Attention-deficit hyperactivity disorder, unspecified type: Secondary | ICD-10-CM | POA: Diagnosis not present

## 2020-11-12 DIAGNOSIS — Z630 Problems in relationship with spouse or partner: Secondary | ICD-10-CM | POA: Diagnosis not present

## 2020-11-15 DIAGNOSIS — F4322 Adjustment disorder with anxiety: Secondary | ICD-10-CM | POA: Diagnosis not present

## 2020-11-20 ENCOUNTER — Ambulatory Visit (INDEPENDENT_AMBULATORY_CARE_PROVIDER_SITE_OTHER): Payer: BC Managed Care – PPO

## 2020-11-20 DIAGNOSIS — J309 Allergic rhinitis, unspecified: Secondary | ICD-10-CM | POA: Diagnosis not present

## 2020-11-26 DIAGNOSIS — F909 Attention-deficit hyperactivity disorder, unspecified type: Secondary | ICD-10-CM | POA: Diagnosis not present

## 2020-11-28 ENCOUNTER — Other Ambulatory Visit: Payer: Self-pay

## 2020-11-28 DIAGNOSIS — F909 Attention-deficit hyperactivity disorder, unspecified type: Secondary | ICD-10-CM

## 2020-11-29 DIAGNOSIS — F4322 Adjustment disorder with anxiety: Secondary | ICD-10-CM | POA: Diagnosis not present

## 2020-12-02 NOTE — Telephone Encounter (Signed)
Refused Rx request for Adderall XR 20mg . Patient needs to be seen in the office prior to additional refills.

## 2020-12-03 NOTE — Telephone Encounter (Signed)
Patient calls nurse line checking status of refill. Patient advised of denial due to apt needed. Patient scheduled for PCP first available on 11/29. Patient is asking for a 1 month supply to get him to apt. Please advise.

## 2020-12-04 ENCOUNTER — Ambulatory Visit (INDEPENDENT_AMBULATORY_CARE_PROVIDER_SITE_OTHER): Payer: BC Managed Care – PPO | Admitting: *Deleted

## 2020-12-04 DIAGNOSIS — J309 Allergic rhinitis, unspecified: Secondary | ICD-10-CM | POA: Diagnosis not present

## 2020-12-04 MED ORDER — AMPHETAMINE-DEXTROAMPHET ER 20 MG PO CP24: 20.0000 mg | ORAL_CAPSULE | ORAL | 0 refills | Status: DC

## 2020-12-04 NOTE — Telephone Encounter (Signed)
Rx sent for 1 month supply. 

## 2020-12-06 DIAGNOSIS — F909 Attention-deficit hyperactivity disorder, unspecified type: Secondary | ICD-10-CM | POA: Diagnosis not present

## 2020-12-12 DIAGNOSIS — J3089 Other allergic rhinitis: Secondary | ICD-10-CM | POA: Diagnosis not present

## 2020-12-12 NOTE — Progress Notes (Signed)
VIALS MADE. EXP 12-12-21 

## 2020-12-13 DIAGNOSIS — F4322 Adjustment disorder with anxiety: Secondary | ICD-10-CM | POA: Diagnosis not present

## 2020-12-19 DIAGNOSIS — F909 Attention-deficit hyperactivity disorder, unspecified type: Secondary | ICD-10-CM | POA: Diagnosis not present

## 2020-12-24 DIAGNOSIS — F909 Attention-deficit hyperactivity disorder, unspecified type: Secondary | ICD-10-CM | POA: Diagnosis not present

## 2020-12-27 DIAGNOSIS — F4322 Adjustment disorder with anxiety: Secondary | ICD-10-CM | POA: Diagnosis not present

## 2020-12-31 ENCOUNTER — Ambulatory Visit (INDEPENDENT_AMBULATORY_CARE_PROVIDER_SITE_OTHER): Payer: BC Managed Care – PPO | Admitting: *Deleted

## 2020-12-31 DIAGNOSIS — J309 Allergic rhinitis, unspecified: Secondary | ICD-10-CM | POA: Diagnosis not present

## 2021-01-07 DIAGNOSIS — F909 Attention-deficit hyperactivity disorder, unspecified type: Secondary | ICD-10-CM | POA: Diagnosis not present

## 2021-01-08 ENCOUNTER — Encounter: Payer: Self-pay | Admitting: Family Medicine

## 2021-01-08 ENCOUNTER — Other Ambulatory Visit: Payer: Self-pay

## 2021-01-08 ENCOUNTER — Ambulatory Visit: Payer: BC Managed Care – PPO | Admitting: Family Medicine

## 2021-01-08 VITALS — BP 124/85 | HR 73 | Ht 68.0 in | Wt 225.5 lb

## 2021-01-08 DIAGNOSIS — F909 Attention-deficit hyperactivity disorder, unspecified type: Secondary | ICD-10-CM

## 2021-01-08 MED ORDER — AMPHETAMINE-DEXTROAMPHET ER 25 MG PO CP24: 25.0000 mg | ORAL_CAPSULE | ORAL | 0 refills | Status: DC

## 2021-01-08 NOTE — Progress Notes (Signed)
    SUBJECTIVE:   CHIEF COMPLAINT / HPI:   Medication Management, ADHD Currently taking Adderall XR 20mg  daily. Has been stable on this dose for the past >1 year. Reports he's doing well but feels like the medication doesn't have as much efficacy as before. Wondering if he could benefit from a higher dose. Denies any adverse effects. He previously had dry mouth with higher doses but this hasn't been a problem for quite some time. Reports he does have some difficulty with sleep but feels this is unrelated to medication. It improves with proper sleep hygiene.  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   PERTINENT  PMH / PSH: ADHD, depressed mood, elevated BMI  OBJECTIVE:   BP 124/85   Pulse 73   Ht 5\' 8"  (1.727 m)   Wt 225 lb 8 oz (102.3 kg)   SpO2 100%   BMI 34.29 kg/m   General: NAD, pleasant, able to participate in exam Respiratory: No respiratory distress Skin: warm and dry, no rashes noted Psych: Normal affect and mood Neuro: grossly intact  ASSESSMENT/PLAN:   Attention deficit hyperactivity disorder (ADHD) Established problem, overall well-controlled with Adderall. Recently, however, he has not had as much improvement and patient feels he would benefit from trial of higher dose. -Increase dose of Adderall XR from 20mg  to 25mg  daily -Recommend obtaining ASRS (adult ADHD self-report scale symptom checklist) at next visit to objectively monitor response to treatment -Patient to contact our office if any adverse effects with higher dose    Alcus Dad, MD [place]

## 2021-01-08 NOTE — Patient Instructions (Signed)
It was great to see you!  We will increase your Adderall XR from 20mg  to 25mg  daily. I have sent a new prescription to your pharmacy. If you notice any adverse effects with this, please let us know.  Good luck with the remainder of school and with your job search.   Dr. [facility]

## 2021-01-09 NOTE — Assessment & Plan Note (Signed)
Established problem, overall well-controlled with Adderall. Recently, however, he has not had as much improvement and patient feels he would benefit from trial of higher dose. -Increase dose of Adderall XR from 20mg  to 25mg  daily -Recommend obtaining ASRS (adult ADHD self-report scale symptom checklist) at next visit to objectively monitor response to treatment -Patient to contact our office if any adverse effects with higher dose

## 2021-01-16 DIAGNOSIS — F909 Attention-deficit hyperactivity disorder, unspecified type: Secondary | ICD-10-CM | POA: Diagnosis not present

## 2021-01-16 DIAGNOSIS — F4322 Adjustment disorder with anxiety: Secondary | ICD-10-CM | POA: Diagnosis not present

## 2021-01-21 DIAGNOSIS — F909 Attention-deficit hyperactivity disorder, unspecified type: Secondary | ICD-10-CM | POA: Diagnosis not present

## 2021-01-28 DIAGNOSIS — F909 Attention-deficit hyperactivity disorder, unspecified type: Secondary | ICD-10-CM | POA: Diagnosis not present

## 2021-01-30 DIAGNOSIS — F909 Attention-deficit hyperactivity disorder, unspecified type: Secondary | ICD-10-CM | POA: Diagnosis not present

## 2021-02-25 ENCOUNTER — Encounter: Payer: Self-pay | Admitting: Family Medicine

## 2021-02-25 ENCOUNTER — Ambulatory Visit (INDEPENDENT_AMBULATORY_CARE_PROVIDER_SITE_OTHER): Payer: 59 | Admitting: Family Medicine

## 2021-02-25 ENCOUNTER — Other Ambulatory Visit: Payer: Self-pay

## 2021-02-25 ENCOUNTER — Ambulatory Visit (INDEPENDENT_AMBULATORY_CARE_PROVIDER_SITE_OTHER): Payer: 59

## 2021-02-25 VITALS — BP 107/75 | HR 82 | Ht 68.0 in | Wt 216.2 lb

## 2021-02-25 DIAGNOSIS — F909 Attention-deficit hyperactivity disorder, unspecified type: Secondary | ICD-10-CM

## 2021-02-25 DIAGNOSIS — Z23 Encounter for immunization: Secondary | ICD-10-CM

## 2021-02-25 DIAGNOSIS — L989 Disorder of the skin and subcutaneous tissue, unspecified: Secondary | ICD-10-CM | POA: Diagnosis not present

## 2021-02-25 NOTE — Patient Instructions (Addendum)
It was nice seeing you today!  Schedule an appointment with our dermatology clinic at the front desk on your way out for possible biopsy.  Stay well, Nathaniel Button, MD [place] [phone]  --  Make sure to check out at the front desk before you leave today.  Please arrive at least 15 minutes prior to your scheduled appointments.  If you had blood work today, I will send you a MyChart message or a letter if results are normal. Otherwise, I will give you a call.  If you had a referral placed, they will call you to set up an appointment. Please give [place] a call if you don't hear back in the next 2 weeks.  If you need additional refills before your next appointment, please call your pharmacy first.

## 2021-02-25 NOTE — Progress Notes (Signed)
° ° °  SUBJECTIVE:   CHIEF COMPLAINT / HPI: bumps on chest  Patient reports bump on his chest that has been present for at least 1 year which has recently been getting bigger, and more irritating and itchy.  He is currently unemployed, used to work in [employer].  Denies frequent [employer] exposure.  No personal or family history of skin cancers.  PERTINENT  PMH / PSH: ADHD  OBJECTIVE:   BP 107/75    Pulse 82    Ht 5\' 8"  (1.727 m)    Wt 216 lb 3.2 oz (98.1 kg)    SpO2 99%    BMI 32.87 kg/m   General: Alert, NAD CV: Regular rate Pulm: No respiratory distress Derm: On his mid chest there is a approximately 1.5 cm x 0.5 cm erythematous raised lesion.  On his mid lower back there is a small fleshy growth consistent with acrochordon.    ASSESSMENT/PLAN:   Skin lesion On further chart review, it appears this lesion on his chest may have been present since 2019.  Appearance and growth are concerning for malignancy, possibly BCC based on appearance.  I believe this will require biopsy.  He will schedule with [facility] dermatology clinic for further evaluation.   HCM Flu shot given today  Zola Button, MD [place]

## 2021-02-25 NOTE — Assessment & Plan Note (Signed)
On further chart review, it appears this lesion on his chest may have been present since 2019.  Appearance and growth are concerning for malignancy, possibly BCC based on appearance.  I believe this will require biopsy.  He will schedule with [facility] dermatology clinic for further evaluation.

## 2021-02-26 ENCOUNTER — Other Ambulatory Visit: Payer: Self-pay | Admitting: Family Medicine

## 2021-02-26 MED ORDER — AMPHETAMINE-DEXTROAMPHET ER 25 MG PO CP24: 25.0000 mg | ORAL_CAPSULE | ORAL | 0 refills | Status: DC

## 2021-03-01 ENCOUNTER — Ambulatory Visit (INDEPENDENT_AMBULATORY_CARE_PROVIDER_SITE_OTHER): Payer: 59 | Admitting: *Deleted

## 2021-03-01 DIAGNOSIS — J309 Allergic rhinitis, unspecified: Secondary | ICD-10-CM | POA: Diagnosis not present

## 2021-03-14 ENCOUNTER — Ambulatory Visit (INDEPENDENT_AMBULATORY_CARE_PROVIDER_SITE_OTHER): Payer: 59 | Admitting: Family Medicine

## 2021-03-14 ENCOUNTER — Other Ambulatory Visit: Payer: Self-pay

## 2021-03-14 ENCOUNTER — Ambulatory Visit (INDEPENDENT_AMBULATORY_CARE_PROVIDER_SITE_OTHER): Payer: 59

## 2021-03-14 DIAGNOSIS — L910 Hypertrophic scar: Secondary | ICD-10-CM | POA: Insufficient documentation

## 2021-03-14 DIAGNOSIS — J309 Allergic rhinitis, unspecified: Secondary | ICD-10-CM | POA: Diagnosis not present

## 2021-03-14 MED ORDER — TRIAMCINOLONE ACETONIDE 40 MG/ML IJ SUSP
20.0000 mg | Freq: Once | INTRAMUSCULAR | Status: AC
  Administered 2021-03-14: 20 mg via INTRA_ARTICULAR

## 2021-03-14 NOTE — Progress Notes (Signed)
° ° °  SUBJECTIVE:   CHIEF COMPLAINT / HPI:   Skin Concern Nathaniel Soto is a 36 y.o. male who presents to the [facility] clinic for central right-sided chest lesion. He was last seen in our clinic on 1/16 for annual visit. He reports that he first noticed the lesion one year ago. Feels that it is growing in size.  It is occasionally itchy.  He has not tried anything for it.  Denies any trauma to the area, though does note that it started off as a bump that he would pick up.  He tried to pop it unsuccessfully.  Does not spend a lot of time in the sun.  He has no family history of skin cancer or other cancers that he knows of.  PERTINENT  PMH / PSH:  Past Medical History:  Diagnosis Date   ADHD (attention deficit hyperactivity disorder)    OBJECTIVE:   BP 133/77    Pulse 66    Ht 5\' 8"  (1.727 m)    Wt 220 lb (99.8 kg)    SpO2 100%    BMI 33.45 kg/m    General: NAD, pleasant, able to participate in exam Respiratory: Breathing comfortably on room air Skin: Approximately 2 x 1 cm erythematous, raised, hypertrophic lesion just to the right of midline on his chest. There is a hair growing from it. Psych: Normal affect and mood     ASSESSMENT/PLAN:   Hypertrophic scar of skin Reviewed previous note from last month with concern for possible BCC. Patient referred here for biopsy.  After further evaluation, reassuring that there is a hair growing from it-not seen as much and malignant lesions.  Due to concern for possible hypertrophic scar, opted to perform intralesional steroid injection as excision could make lesion recur larger. Benefits, risks and alternatives provided to patient. Consent signed. Injected 20 mg intralesional.  -F/u in 1 month; can receive monthly injections if improving -If worsening or no improvement; can consider biopsy     Sharion Settler, [place]

## 2021-03-14 NOTE — Patient Instructions (Signed)
It was wonderful to see you today.  Today we talked about:  -We believe that you may have hypertrophic scar.  This occurs due to inflammation after potential trauma. Today we injected steroid into your skin.  This should help to reduce the size and help with itching. -Please follow-up in 1 month for reassessment.  You can receive another injection of steroid at that time if you noted improvement. Otherwise, we may consider biopsy if worsening or no improvement.   Thank you for choosing [facility].   Please call [phone] with any questions about today's appointment.  Please be sure to schedule follow up at the front  desk before you leave today.   Sharion Settler, DO PGY-2 Family Medicine

## 2021-03-14 NOTE — Assessment & Plan Note (Signed)
Reviewed previous note from last month with concern for possible BCC. Patient referred here for biopsy.  After further evaluation, reassuring that there is a hair growing from it-not seen as much and malignant lesions.  Due to concern for possible hypertrophic scar, opted to perform intralesional steroid injection as excision could make lesion recur larger. Benefits, risks and alternatives provided to patient. Consent signed. Injected 20 mg intralesional.  -F/u in 1 month; can receive monthly injections if improving -If worsening or no improvement; can consider biopsy

## 2021-03-14 NOTE — Addendum Note (Signed)
Addended by: Ozella Almond on: 03/14/2021 02:34 PM   Modules accepted: Orders

## 2021-03-21 ENCOUNTER — Ambulatory Visit (INDEPENDENT_AMBULATORY_CARE_PROVIDER_SITE_OTHER): Payer: 59

## 2021-03-21 DIAGNOSIS — J309 Allergic rhinitis, unspecified: Secondary | ICD-10-CM | POA: Diagnosis not present

## 2021-03-29 ENCOUNTER — Ambulatory Visit (INDEPENDENT_AMBULATORY_CARE_PROVIDER_SITE_OTHER): Payer: 59

## 2021-03-29 DIAGNOSIS — J309 Allergic rhinitis, unspecified: Secondary | ICD-10-CM | POA: Diagnosis not present

## 2021-04-01 ENCOUNTER — Other Ambulatory Visit: Payer: Self-pay

## 2021-04-01 DIAGNOSIS — F909 Attention-deficit hyperactivity disorder, unspecified type: Secondary | ICD-10-CM

## 2021-04-01 MED ORDER — AMPHETAMINE-DEXTROAMPHET ER 25 MG PO CP24: 25.0000 mg | ORAL_CAPSULE | ORAL | 0 refills | Status: DC

## 2021-04-08 ENCOUNTER — Ambulatory Visit (INDEPENDENT_AMBULATORY_CARE_PROVIDER_SITE_OTHER): Payer: 59

## 2021-04-08 DIAGNOSIS — J309 Allergic rhinitis, unspecified: Secondary | ICD-10-CM | POA: Diagnosis not present

## 2021-04-09 NOTE — Patient Instructions (Addendum)
It was wonderful to see you today. ? ?Today we talked about: ? ?-We injected the area with steroid again today.  ?-Return in 1 month for repeat injection if continuing to improve.  ? ? ?Thank you for choosing [facility].  ? ?Please call [phone] with any questions about today's appointment. ? ?Please be sure to schedule follow up at the front  desk before you leave today.  ? ?Sharion Settler, DO ?PGY-2 Family Medicine   ?

## 2021-04-09 NOTE — Progress Notes (Signed)
? ? ?  SUBJECTIVE:  ? ?CHIEF COMPLAINT / HPI:  ? ?F/u Hypertrophic Scar ?Nathaniel Soto is a 36 y.o. male who presents to the [facility] clinic seen on 2/2 in Derm clnic. Had an approximately 2x1 cm erythematous and raised hypertrophic lesion to the right of midline on chest. He received intralesional steroid injection. Presents today for follow up.  Reports that it is not bothering him anymore.  He believes it has gotten a little bit smaller.  Feels like "2 bumps" instead of one. No pain.  ? ? ?PERTINENT  PMH / PSH:  ?Past Medical History:  ?Diagnosis Date  ? ADHD (attention deficit hyperactivity disorder)   ? ? ?OBJECTIVE:  ? ?BP 114/78   Pulse 68   Ht 5\' 7"  (1.702 m)   Wt 215 lb 12.8 oz (97.9 kg)   SpO2 95%   BMI 33.80 kg/m?   ? ?General: NAD, pleasant, able to participate in exam ?Respiratory: Breathing comfortably ?Skin: warm and dry, no rashes noted, erythematous and raised area approximately 1.8 cm just to right of midline on chest- it is flattened in the middle and raised on each side laterally.  ?Psych: Normal affect and mood ? ? ? ? ?ASSESSMENT/PLAN:  ? ?Hypertrophic scar of skin ?Appears improved from compared to visit on 2/2.  It is not as erythematous and there is a flattened area in the middle (whereas previously it was all raised). It also appears smaller in width when compared to previous. Patient would like steroid injection again today. Discussed that this will likely be a slow process but he can return monthly for repeat steroid injection if it continues to improve. Consent signed for repeat intralesional steroid injection. Alternatives, risks and benefits discussed.  20 mg of triamcinolone acetonide was injected into the area. Slight bleeding at injection sites that was easily controlled with pressure.  Patient tolerated procedure well. ?  ? ? ?Sharion Settler, DO ?[place]  ? ?

## 2021-04-11 ENCOUNTER — Encounter: Payer: Self-pay | Admitting: Family Medicine

## 2021-04-11 ENCOUNTER — Other Ambulatory Visit: Payer: Self-pay

## 2021-04-11 ENCOUNTER — Ambulatory Visit: Payer: 59 | Admitting: Family Medicine

## 2021-04-11 DIAGNOSIS — L910 Hypertrophic scar: Secondary | ICD-10-CM | POA: Diagnosis not present

## 2021-04-11 MED ORDER — TRIAMCINOLONE ACETONIDE 40 MG/ML IJ SUSP
20.0000 mg | Freq: Once | INTRAMUSCULAR | Status: AC
  Administered 2021-04-11: 20 mg via INTRADERMAL

## 2021-04-11 NOTE — Assessment & Plan Note (Addendum)
Appears improved from compared to visit on 2/2.  It is not as erythematous and there is a flattened area in the middle (whereas previously it was all raised). It also appears smaller in width when compared to previous. Patient would like steroid injection again today. Discussed that this will likely be a slow process but he can return monthly for repeat steroid injection if it continues to improve. Consent signed for repeat intralesional steroid injection. Alternatives, risks and benefits discussed.  20 mg of triamcinolone acetonide was injected into the area. Slight bleeding at injection sites that was easily controlled with pressure.  Patient tolerated procedure well. ?

## 2021-04-19 ENCOUNTER — Ambulatory Visit (INDEPENDENT_AMBULATORY_CARE_PROVIDER_SITE_OTHER): Payer: 59

## 2021-04-19 DIAGNOSIS — J309 Allergic rhinitis, unspecified: Secondary | ICD-10-CM | POA: Diagnosis not present

## 2021-04-29 ENCOUNTER — Ambulatory Visit (INDEPENDENT_AMBULATORY_CARE_PROVIDER_SITE_OTHER): Payer: 59

## 2021-04-29 DIAGNOSIS — J309 Allergic rhinitis, unspecified: Secondary | ICD-10-CM

## 2021-05-02 ENCOUNTER — Other Ambulatory Visit: Payer: Self-pay | Admitting: *Deleted

## 2021-05-02 DIAGNOSIS — F909 Attention-deficit hyperactivity disorder, unspecified type: Secondary | ICD-10-CM

## 2021-05-02 MED ORDER — AMPHETAMINE-DEXTROAMPHET ER 25 MG PO CP24: 25.0000 mg | ORAL_CAPSULE | ORAL | 0 refills | Status: DC

## 2021-05-10 ENCOUNTER — Ambulatory Visit (INDEPENDENT_AMBULATORY_CARE_PROVIDER_SITE_OTHER): Payer: 59 | Admitting: Family Medicine

## 2021-05-10 ENCOUNTER — Encounter: Payer: Self-pay | Admitting: Family Medicine

## 2021-05-10 VITALS — BP 121/73 | HR 85 | Ht 67.0 in | Wt 213.2 lb

## 2021-05-10 DIAGNOSIS — Z6833 Body mass index (BMI) 33.0-33.9, adult: Secondary | ICD-10-CM | POA: Diagnosis not present

## 2021-05-10 DIAGNOSIS — F909 Attention-deficit hyperactivity disorder, unspecified type: Secondary | ICD-10-CM | POA: Diagnosis not present

## 2021-05-10 DIAGNOSIS — Z6831 Body mass index (BMI) 31.0-31.9, adult: Secondary | ICD-10-CM | POA: Insufficient documentation

## 2021-05-10 LAB — POCT GLYCOSYLATED HEMOGLOBIN (HGB A1C): Hemoglobin A1C: 5.3 % (ref 4.0–5.6)

## 2021-05-10 MED ORDER — AMPHETAMINE-DEXTROAMPHET ER 25 MG PO CP24: 25.0000 mg | ORAL_CAPSULE | ORAL | 0 refills | Status: DC

## 2021-05-10 NOTE — Patient Instructions (Signed)
It was wonderful to meet you today. Thank you for allowing me to be a part of your care. Below is a short summary of what we discussed at your visit today: ? ?ADHD ?Today we talked about your ADHD medication. Since you are doing well on this dose, we will not make any changes.  ? ?I will send in 3 scripts (1 each for the next 3 months) so essentially have a 60-monthsupply.  Come back in approximately 3 months for next check in.  Of course, if you need any adjustments prior to that, please appear to call u[place] MyChart [place] or make an appointment. ? ?A1c ?Today we will check your A1c to see where you are at.  If it is within a normal range, you will be okay to stop metformin. ? ?Cooking and Nutrition Classes ?[employer] in [place] many classes at low or no cost to [employer]De[employer] nutrition, and agriculture.  Their website offers a huge variety of information related to topics such as gardening, nutrition, cooking, parenting, and health.  Also listed are classes and events, both online and in-person.  Check out their website here: [email or web address] ? ?Employment / JTherapist, occupational?[employer] [place] [phone]/ 6[employer] (Client preference), Accepting New clients  ?[place] Works CArtist([place]: [phone](GCamp [place] / [phone](HP) Virtual & Onsite workshops, Accepting new clients  ?TS[place]as [phone]/ [phone] & Onsite , Accepting New clients  ?G[place]ique [phone]  ?DH[employer] First: [phone]([place] / [phone]([place]land[place] Virtually, Accepting clients   ?[place]  [phone] Virtual and Onsite, Accepting new clients   ?  ?Financial Assistance ?[place]  [phone] (financial assistance) & Onsite (all other services), Accepting new families  ?[employer]:  [phone]  ?BDelsa [place](furniture):  [phone]  ?[place] [employer]: [phone]  ?Low Income Energy Assistance: [phone], accepting new families  ?  ?Food Assistance ?DHHS- SNAP/ Food Stamps: [phone]  ?WIC: [phone];  HP [phone]  ?   ?   ?During the summer, text "FOOD" to [phone]  ?  ?General Health / Clinics (Adults) ?[facility] Card (for Adults) through [employer] ([phone]   ?[place]   [phone]  ?[place]   [phone]  ?Health Department:  [phone]  ?[place]  [phone]/ [phone]  ?[facility] [place]   [phone], Accepting new patients  ?[facility]   [phone] [phone] , Accepting new patients  ? ?LGBTQ ?[employer]  www.youthsafegso.org  Virtual, Accepting new members  ?PFLAG  [phone]/ info'[email or web address] .org  Virtual, Accepting new Members  ?[employer]  [phone] Virtual  ?   ?Sports & Recreation ?[employer] Open Doors Application: yImDemand.esOnsite, Accepting new families  ?City of [place] http://www.[place].gov/index.aspx?page=3615 Onsite  ?  ? ? ?Please bring all of your medications to every appointment! ? ?If you have any questions or concerns, please do not hesitate to contact u[place]avia phone or MyChart message.  ? ?CEzequiel Essex MD  ?

## 2021-05-10 NOTE — Assessment & Plan Note (Signed)
Doing well on Adderall XR 25 mg.  No adverse side effects.  We will send in 3 monthly scripts for essentially a 71-monthsupply.  Next appointment for follow-up in 3 months.  Return precautions given.  See AVS for more. ?

## 2021-05-10 NOTE — Assessment & Plan Note (Signed)
Previously prescribed metformin through weight loss program.  Not currently under care of his program anymore, however continues to take metformin because the prescription is still active.  Does not believe this is helping with his weight loss.  We will check A1c before he leaves today.  If prediabetic, could consider GLP-1's. ?

## 2021-05-10 NOTE — Progress Notes (Addendum)
? ? ?  SUBJECTIVE:  ? ?CHIEF COMPLAINT / HPI:  ? ?ADD, depression med refill ?-Current regimen 25 mg XR every morning ?-Patient reports he is doing quite well on this, does not feel like he needs a change in dose ?-Stopped Lexapro 10 mg daily approx 2-3 months ago, doing well since ? ?Overweight BMI ?-Metformin previously prescribed by "[facility]", a weight loss clinic ?-Labs done there all unremarkable or normal per patient, glucose within normal ranges ?-Patient continues to take metformin because he has the prescription, however he is not currently under the care of the [facility] anymore ?-He does not believe the metformin is anything for his weight loss goals ? ?PERTINENT  PMH / PSH: ADHD, depression ? ?OBJECTIVE:  ? ?BP 121/73   Pulse 85   Ht '5\' 7"'$  (1.702 m)   Wt 213 lb 3.2 oz (96.7 kg)   SpO2 100%   BMI 33.39 kg/m?   ? ?PHQ-9:  ? ?  05/10/2021  ?  2:04 PM 04/11/2021  ?  1:30 PM 02/25/2021  ?  3:27 PM  ?Depression screen PHQ 2/9  ?Decreased Interest 0 0 0  ?Down, Depressed, Hopeless 0 0 0  ?PHQ - 2 Score 0 0 0  ?Altered sleeping '1 1 2  '$ ?Tired, decreased energy '1 1 1  '$ ?Change in appetite 1 1 0  ?Feeling bad or failure about yourself  1 1 0  ?Trouble concentrating 0 0 0  ?Moving slowly or fidgety/restless 0 0 0  ?Suicidal thoughts 0 0 0  ?PHQ-9 Score '4 4 3  '$ ?Difficult doing work/chores Not difficult at all Not difficult at all Somewhat difficult  ?  ?Physical Exam ?General: Awake, alert, oriented ?Cardiovascular: Regular rate and rhythm, S1 and S2 present, no murmurs auscultated ?Respiratory: Lung fields clear to auscultation bilaterally ? ?ASSESSMENT/PLAN:  ? ?Attention deficit hyperactivity disorder (ADHD) ?Doing well on Adderall XR 25 mg.  No adverse side effects.  We will send in 3 monthly scripts for essentially a 35-monthsupply.  Next appointment for follow-up in 3 months.  Return precautions given.  See AVS for more. ? ?BMI 33.0-33.9,adult ?Previously prescribed metformin through weight  loss program.  Not currently under care of his program anymore, however continues to take metformin because the prescription is still active.  Does not believe this is helping with his weight loss.  We will check A1c before he leaves today.  If prediabetic, could consider GLP-1's. ?  ? ? ?CEzequiel Essex MD ?[place] ?

## 2021-05-24 ENCOUNTER — Ambulatory Visit (INDEPENDENT_AMBULATORY_CARE_PROVIDER_SITE_OTHER): Payer: 59

## 2021-05-24 DIAGNOSIS — J309 Allergic rhinitis, unspecified: Secondary | ICD-10-CM

## 2021-05-30 ENCOUNTER — Ambulatory Visit: Payer: 59 | Admitting: Student

## 2021-05-30 DIAGNOSIS — L910 Hypertrophic scar: Secondary | ICD-10-CM | POA: Diagnosis not present

## 2021-05-30 MED ORDER — TRIAMCINOLONE ACETONIDE 0.5 % EX OINT: TOPICAL_OINTMENT | CUTANEOUS | 0 refills | Status: DC

## 2021-05-30 NOTE — Progress Notes (Signed)
? ? ?  SUBJECTIVE:  ? ?CHIEF COMPLAINT / HPI:  ? ?Pt returns for further evaluation of hypertrophic lesion to the right of midline on chest for which he has previously received intralesional steroid injections. Comes today as he has noticed a few small red bumps surrounding the lesion. States that he is no longer experiencing discomfort from the lesion as he previously was. He just wants to have these new red bumps checked out.  ? ?PERTINENT  PMH / PSH: Hypertrophic scar ? ?OBJECTIVE:  ? ?BP 107/69   Pulse 73   Ht '5\' 7"'$  (1.702 m)   Wt 207 lb (93.9 kg)   SpO2 100%   BMI 32.42 kg/m?   ? ?General: NAD, pleasant, able to participate in exam ?Cardio: well perfused ?Respiratory: normal WOB ?Skin: erythematous and raised area to right of midline on chest with 4 small erythematous bumps surrounding area. See photo in media tab ? ? ?ASSESSMENT/PLAN:  ? ?Hypertrophic scar of skin ?Small raised erythematous areas surrounding scare are likely resulting from irritation after multiple steroid injections. As scar is no longer causing pt discomfort and steroid injections may be irritating the skin, it is advised to refrain from steroid injetions at this time. Will prescribe a topical steroid.  ?-Triamcinolone ointment 0.5%, apply only to scar BID ?-Pt to return in July if area is bothering him ? ? ?Dr. Precious Gilding, DO ?[place]  ? ? ? ? ?

## 2021-05-30 NOTE — Assessment & Plan Note (Signed)
?  Hypertrophic scar of skin ?Small raised erythematous areas surrounding scare are likely resulting from irritation after multiple steroid injections. As scar is no longer causing pt discomfort and steroid injections may be irritating the skin, it is advised to refrain from steroid injetions at this time. Will prescribe a topical steroid.  ?-Triamcinolone ointment 0.5%, apply only to scar BID ?-Pt to return in July if area is bothering him ?

## 2021-05-30 NOTE — Patient Instructions (Signed)
It was great to see you! Thank you for allowing me to participate in your care! ? ?Our plans for today:  ?- I have sent in a prescription for a steroid ointment to your pharmacy ?- Please return in July if the area is still bothering you ? ? ?Take care and seek immediate care sooner if you develop any concerns.  ? ?Dr. Precious Gilding, DO ?[facility] ? ?

## 2021-07-09 ENCOUNTER — Other Ambulatory Visit: Payer: Self-pay | Admitting: Student

## 2021-07-16 ENCOUNTER — Encounter: Payer: Self-pay | Admitting: *Deleted

## 2021-09-25 ENCOUNTER — Other Ambulatory Visit: Payer: Self-pay

## 2021-09-25 DIAGNOSIS — F909 Attention-deficit hyperactivity disorder, unspecified type: Secondary | ICD-10-CM

## 2021-09-25 MED ORDER — AMPHETAMINE-DEXTROAMPHET ER 25 MG PO CP24: 25.0000 mg | ORAL_CAPSULE | ORAL | 0 refills | Status: DC

## 2021-10-29 ENCOUNTER — Other Ambulatory Visit: Payer: Self-pay

## 2021-10-29 DIAGNOSIS — F909 Attention-deficit hyperactivity disorder, unspecified type: Secondary | ICD-10-CM

## 2021-10-29 MED ORDER — AMPHETAMINE-DEXTROAMPHET ER 25 MG PO CP24: 25.0000 mg | ORAL_CAPSULE | ORAL | 0 refills | Status: DC

## 2021-11-26 ENCOUNTER — Ambulatory Visit: Payer: 59 | Admitting: Family Medicine

## 2021-12-03 ENCOUNTER — Ambulatory Visit (INDEPENDENT_AMBULATORY_CARE_PROVIDER_SITE_OTHER): Payer: 59 | Admitting: Family Medicine

## 2021-12-03 VITALS — BP 108/64 | HR 60 | Ht 68.0 in | Wt 202.6 lb

## 2021-12-03 DIAGNOSIS — F909 Attention-deficit hyperactivity disorder, unspecified type: Secondary | ICD-10-CM

## 2021-12-03 MED ORDER — AMPHETAMINE-DEXTROAMPHET ER 25 MG PO CP24: 25.0000 mg | ORAL_CAPSULE | ORAL | 0 refills | Status: DC

## 2021-12-03 NOTE — Progress Notes (Signed)
    SUBJECTIVE:   CHIEF COMPLAINT / HPI:   ADHD medication refill Nathaniel Soto is a pleasant 36 year old man here today for medication refill. Currently taking Adderall XR 25 mg daily. Reports this is controlling his symptoms well. No adverse side effects. Denies increased anxiety, tremors, tachycardia. No concurrent use of tobacco products or illicit substances. Social ETOH consumption. He feels happy with his dose and does not wish to change management at this time.   PERTINENT  PMH / PSH:  Patient Active Problem List   Diagnosis Date Noted   BMI 33.0-33.9,adult 05/10/2021   Hypertrophic scar of skin 03/14/2021   Depression, major, single episode, mild ([place]) 03/30/2019   Skin lesion 11/06/2017   Bony exostosis 08/02/2014   Attention deficit hyperactivity disorder (ADHD) 04/02/2012    OBJECTIVE:   BP 108/64   Pulse 60   Ht '5\' 8"'$  (1.727 m)   Wt 202 lb 9.6 oz (91.9 kg)   SpO2 97%   BMI 30.81 kg/m    PHQ-9:     12/03/2021    3:24 PM 05/10/2021    2:04 PM 04/11/2021    1:30 PM  Depression screen PHQ 2/9  Decreased Interest 0 0 0  Down, Depressed, Hopeless 0 0 0  PHQ - 2 Score 0 0 0  Altered sleeping 0 1 1  Tired, decreased energy 0 1 1  Change in appetite 0 1 1  Feeling bad or failure about yourself  0 1 1  Trouble concentrating 0 0 0  Moving slowly or fidgety/restless 0 0 0  Suicidal thoughts 0 0 0  PHQ-9 Score 0 4 4  Difficult doing work/chores Not difficult at all Not difficult at all Not difficult at all    Physical Exam General: Awake, alert, oriented Cardiovascular: Regular rate and rhythm, S1 and S2 present, no murmurs auscultated Respiratory: Lung fields clear to auscultation bilaterally  ASSESSMENT/PLAN:   Attention deficit hyperactivity disorder (ADHD) Refill of Adderall XR 25 mg daily. Provided 3 one-month scripts. Next follow up in 3 months.      Ezequiel Essex, MD [place]

## 2021-12-03 NOTE — Patient Instructions (Signed)
It was wonderful to see you today. Thank you for allowing me to be a part of your care. Below is a short summary of what we discussed at your visit today:  ADHD meds I have sent in 3 months worth of your Adderall prescription.  Please come back in late January for your next check in.  Flu shot You may schedule a nurse only visit for your flu shot at any time that is convenient for you.  Please call the front desk to schedule this.   Please bring all of your medications to every appointment!  If you have any questions or concerns, please do not hesitate to contact us via phone or MyChart message.   Ezequiel Essex, MD

## 2021-12-04 NOTE — Progress Notes (Incomplete)
    SUBJECTIVE:   CHIEF COMPLAINT / HPI:   ***  PERTINENT  PMH / PSH:  Patient Active Problem List   Diagnosis Date Noted  . BMI 33.0-33.9,adult 05/10/2021  . Hypertrophic scar of skin 03/14/2021  . Depression, major, single episode, mild ([place]) 03/30/2019  . Skin lesion 11/06/2017  . Bony exostosis 08/02/2014  . Attention deficit hyperactivity disorder (ADHD) 04/02/2012    OBJECTIVE:   BP 108/64   Pulse 60   Ht '5\' 8"'$  (1.727 m)   Wt 202 lb 9.6 oz (91.9 kg)   SpO2 97%   BMI 30.81 kg/m    PHQ-9:     12/03/2021    3:24 PM 05/10/2021    2:04 PM 04/11/2021    1:30 PM  Depression screen PHQ 2/9  Decreased Interest 0 0 0  Down, Depressed, Hopeless 0 0 0  PHQ - 2 Score 0 0 0  Altered sleeping 0 1 1  Tired, decreased energy 0 1 1  Change in appetite 0 1 1  Feeling bad or failure about yourself  0 1 1  Trouble concentrating 0 0 0  Moving slowly or fidgety/restless 0 0 0  Suicidal thoughts 0 0 0  PHQ-9 Score 0 4 4  Difficult doing work/chores Not difficult at all Not difficult at all Not difficult at all    Physical Exam General: Awake, alert, oriented, no acute distress Respiratory: Unlabored respirations, speaking in full sentences, no respiratory distress Extremities: Moving all extremities spontaneously Neuro: Cranial nerves II through X grossly intact Psych: Normal insight and judgement   ASSESSMENT/PLAN:   No problem-specific Assessment & Plan notes found for this encounter.     Ezequiel Essex, MD [place]

## 2021-12-05 NOTE — Assessment & Plan Note (Signed)
Refill of Adderall XR 25 mg daily. Provided 3 one-month scripts. Next follow up in 3 months.

## 2022-03-14 ENCOUNTER — Other Ambulatory Visit: Payer: Self-pay

## 2022-03-14 DIAGNOSIS — F909 Attention-deficit hyperactivity disorder, unspecified type: Secondary | ICD-10-CM

## 2022-03-17 MED ORDER — AMPHETAMINE-DEXTROAMPHET ER 25 MG PO CP24: 25.0000 mg | ORAL_CAPSULE | ORAL | 0 refills | Status: DC

## 2022-04-18 ENCOUNTER — Other Ambulatory Visit: Payer: Self-pay

## 2022-04-18 DIAGNOSIS — F909 Attention-deficit hyperactivity disorder, unspecified type: Secondary | ICD-10-CM

## 2022-04-21 MED ORDER — AMPHETAMINE-DEXTROAMPHET ER 25 MG PO CP24: 25.0000 mg | ORAL_CAPSULE | ORAL | 0 refills | Status: DC

## 2022-04-23 NOTE — Telephone Encounter (Signed)
Called patient and scheduled appointment for med refills.

## 2022-04-24 ENCOUNTER — Telehealth: Payer: Self-pay

## 2022-04-24 ENCOUNTER — Other Ambulatory Visit (HOSPITAL_COMMUNITY): Payer: Self-pay

## 2022-04-24 NOTE — Telephone Encounter (Signed)
A Prior Authorization was initiated for this patients amphetamine-dextroamphetamine  through CoverMyMeds.   Key: [id number]

## 2022-04-25 NOTE — Telephone Encounter (Signed)
Prior Auth for patients medication Amphetamine-Dextroamphet ER 25MG  capsules approved by [facility] from 04/24/22 to 04/23/25.  CoverMyMeds Key: [id number] [place] Case ID #: [id number]

## 2022-05-01 ENCOUNTER — Ambulatory Visit (INDEPENDENT_AMBULATORY_CARE_PROVIDER_SITE_OTHER): Payer: BC Managed Care – PPO | Admitting: Family Medicine

## 2022-05-01 VITALS — BP 108/70 | HR 71 | Wt 205.8 lb

## 2022-05-01 DIAGNOSIS — F909 Attention-deficit hyperactivity disorder, unspecified type: Secondary | ICD-10-CM | POA: Diagnosis not present

## 2022-05-01 DIAGNOSIS — Z6831 Body mass index (BMI) 31.0-31.9, adult: Secondary | ICD-10-CM | POA: Diagnosis not present

## 2022-05-01 NOTE — Progress Notes (Signed)
SUBJECTIVE:   CHIEF COMPLAINT / HPI:   ADHD Follow-Up -longstanding history of ADHD -feels well-managed with adderall-XR 25mg  daily -no side effects, no appetite suppression, no sleep issues -completed his Masters program. Now working in Information systems manager office at [employer] -envisions himself staying on meds for the next 1-2 years, until he's settled into his career more -anticipates looking for different job and would not want to come off meds during this process -does not plan to stay on meds forever -no concomitant substance use or mood concerns  Adult ADHD Self Report Scale (most recent)     Adult ADHD Self-Report Scale (ASRS-v1.1) Symptom Checklist - 05/01/22 1625       Part A   1. How often do you have trouble wrapping up the final details of a project, once the challenging parts have been done? Sometimes  2. How often do you have difficulty getting things done in order when you have to do a task that requires organization? Rarely    3. How often do you have problems remembering appointments or obligations? Sometimes  4. When you have a task that requires a lot of thought, how often do you avoid or delay getting started? Rarely    5. How often do you fidget or squirm with your hands or feet when you have to sit down for a long time? Often  6. How often do you feel overly active and compelled to do things, like you were driven by a motor? Sometimes      Part B   7. How often do you make careless mistakes when you have to work on a boring or difficult project? Rarely  8. How often do you have difficulty keeping your attention when you are doing boring or repetitive work? Very Often    9. How often do you have difficulty concentrating on what people say to you, even when they are speaking to you directly? Rarely  10. How often do you misplace or have difficulty finding things at home or at work? Rarely    11. How often are you distracted by activity or noise around you? Often  12. How often  do you leave your seat in meetings or other situations in which you are expected to remain seated? Rarely    13. How often do you feel restless or fidgety? Rarely  14. How often do you have difficulty unwinding and relaxing when you have time to yourself? Sometimes    15. How often do you find yourself talking too much when you are in social situations? Sometimes  16. When you are in a conversation, how often do you find yourself finishing the sentences of the people you are talking to, before they can finish them themselves? Never    17. How often do you have difficulty waiting your turn in situations when turn taking is required? Rarely  18. How often do you interrupt others when they are busy? Sometimes      Comment   How old were you when these problems first began to occur? 45               PERTINENT  PMH / PSH: elevated BMI  OBJECTIVE:   BP 108/70   Pulse 71   Wt 205 lb 12.8 oz (93.4 kg)   SpO2 98%   BMI 31.29 kg/m   General: NAD, pleasant, able to participate in exam Respiratory: No respiratory distress Skin: warm and dry, no rashes noted Psych: well-groomed, appropriate  eye contact, normal affect and mood, normal speech Neuro: grossly intact  ASSESSMENT/PLAN:   Attention deficit hyperactivity disorder (ADHD) Well-controlled. Continue Adderall XR 25mg  daily. Follow up in 3 months. Will plan for UDS and controlled-substance contract at next visit as this has never been done.   BMI 31.0-31.9,adult Previously on Metformin, which was prescribed through weight loss program, but did not see benefit with this. Has recently resumed dedicated physical activity in the gym. Brief nutrition and exercise counseling provided today. He will continue to work on lifestyle modifications.    Alcus Dad, MD [place]

## 2022-05-01 NOTE — Patient Instructions (Addendum)
It was great to see you!  I will send refills on your Adderall when you are due.  Continue working on healthy lifestyle choices to achieve a healthier BMI. (150 minutes physical activity per week, 5 servings fruits/veggies daily, avoiding sugar-sweetened beverages, limiting processed foods)   Follow up in 3 months for an annual physical.  Take care, Dr Rock Nephew

## 2022-05-02 DIAGNOSIS — J3089 Other allergic rhinitis: Secondary | ICD-10-CM | POA: Insufficient documentation

## 2022-05-02 NOTE — Assessment & Plan Note (Signed)
Previously on Metformin, which was prescribed through weight loss program, but did not see benefit with this. Has recently resumed dedicated physical activity in the gym. Brief nutrition and exercise counseling provided today. He will continue to work on lifestyle modifications.

## 2022-05-02 NOTE — Assessment & Plan Note (Signed)
Well-controlled. Continue Adderall XR 25mg  daily. Follow up in 3 months. Will plan for UDS and controlled-substance contract at next visit as this has never been done.

## 2022-05-26 ENCOUNTER — Other Ambulatory Visit: Payer: Self-pay

## 2022-05-26 DIAGNOSIS — F909 Attention-deficit hyperactivity disorder, unspecified type: Secondary | ICD-10-CM

## 2022-05-27 MED ORDER — AMPHETAMINE-DEXTROAMPHET ER 25 MG PO CP24: 25.0000 mg | ORAL_CAPSULE | ORAL | 0 refills | Status: DC

## 2022-06-27 ENCOUNTER — Other Ambulatory Visit: Payer: Self-pay

## 2022-06-27 DIAGNOSIS — F909 Attention-deficit hyperactivity disorder, unspecified type: Secondary | ICD-10-CM

## 2022-06-27 MED ORDER — AMPHETAMINE-DEXTROAMPHET ER 25 MG PO CP24: 25.0000 mg | ORAL_CAPSULE | ORAL | 0 refills | Status: DC

## 2022-07-29 ENCOUNTER — Other Ambulatory Visit: Payer: Self-pay

## 2022-07-29 DIAGNOSIS — F909 Attention-deficit hyperactivity disorder, unspecified type: Secondary | ICD-10-CM

## 2022-07-29 MED ORDER — AMPHETAMINE-DEXTROAMPHET ER 25 MG PO CP24: 25.0000 mg | ORAL_CAPSULE | ORAL | 0 refills | Status: DC

## 2022-08-28 ENCOUNTER — Other Ambulatory Visit: Payer: Self-pay

## 2022-08-28 DIAGNOSIS — F909 Attention-deficit hyperactivity disorder, unspecified type: Secondary | ICD-10-CM

## 2022-08-28 MED ORDER — AMPHETAMINE-DEXTROAMPHET ER 25 MG PO CP24: 25.0000 mg | ORAL_CAPSULE | ORAL | 0 refills | Status: DC

## 2022-09-26 ENCOUNTER — Other Ambulatory Visit: Payer: Self-pay

## 2022-09-26 DIAGNOSIS — F909 Attention-deficit hyperactivity disorder, unspecified type: Secondary | ICD-10-CM

## 2022-09-26 MED ORDER — AMPHETAMINE-DEXTROAMPHET ER 25 MG PO CP24: 25.0000 mg | ORAL_CAPSULE | ORAL | 0 refills | Status: DC

## 2022-10-01 NOTE — Telephone Encounter (Signed)
Patient calls nurse line regarding Adderall prescription.   [facility] [address] [facility] Requesting that prescription be transferred to another location.   [facility] [address] [facility]. Canceled at [facility] location.   P[facility] for [facility] [address].   Veronda Prude, RN

## 2022-10-01 NOTE — Addendum Note (Signed)
Addended by: Veronda Prude on: 10/01/2022 11:39 AM   Modules accepted: Orders

## 2022-10-02 MED ORDER — AMPHETAMINE-DEXTROAMPHET ER 25 MG PO CP24: 25.0000 mg | ORAL_CAPSULE | ORAL | 0 refills | Status: DC

## 2022-10-07 ENCOUNTER — Ambulatory Visit: Payer: BC Managed Care – PPO | Admitting: Student

## 2022-10-31 ENCOUNTER — Other Ambulatory Visit: Payer: Self-pay

## 2022-10-31 DIAGNOSIS — F909 Attention-deficit hyperactivity disorder, unspecified type: Secondary | ICD-10-CM

## 2022-10-31 MED ORDER — AMPHETAMINE-DEXTROAMPHET ER 25 MG PO CP24: 25.0000 mg | ORAL_CAPSULE | ORAL | 0 refills | Status: DC

## 2022-11-03 NOTE — Addendum Note (Signed)
Addended by: Veronda Prude on: 11/03/2022 03:33 PM   Modules accepted: Orders

## 2022-11-03 NOTE — Telephone Encounter (Signed)
Patient calls nurse line regarding issues with picking up Adderall prescription.   Medication is out of stock at [facility]   Requesting that [facility] to [facility] [address].   Called and canceled at [facility].   Pended prescription to updated pharmacy.   Veronda Prude, RN

## 2022-11-04 ENCOUNTER — Other Ambulatory Visit: Payer: Self-pay | Admitting: Student

## 2022-11-04 DIAGNOSIS — F909 Attention-deficit hyperactivity disorder, unspecified type: Secondary | ICD-10-CM

## 2022-11-04 MED ORDER — AMPHETAMINE-DEXTROAMPHET ER 25 MG PO CP24
25.0000 mg | ORAL_CAPSULE | ORAL | 0 refills | Status: DC
Start: 2022-11-04 — End: 2022-12-03

## 2022-11-04 NOTE — Telephone Encounter (Signed)
[facility] [address] [facility]. Please resend to [facility] [address].   Veronda Prude, RN

## 2022-11-26 ENCOUNTER — Encounter: Payer: Self-pay | Admitting: Student

## 2022-11-26 ENCOUNTER — Ambulatory Visit: Payer: BC Managed Care – PPO | Admitting: Student

## 2022-11-26 VITALS — BP 120/60 | HR 99 | Wt 193.1 lb

## 2022-11-26 DIAGNOSIS — Z13220 Encounter for screening for lipoid disorders: Secondary | ICD-10-CM

## 2022-11-26 DIAGNOSIS — Z13228 Encounter for screening for other metabolic disorders: Secondary | ICD-10-CM | POA: Diagnosis not present

## 2022-11-26 NOTE — Patient Instructions (Signed)
It was great to see you! Thank you for allowing me to participate in your care!   I recommend that you always bring your medications to each appointment as this makes it easy to ensure we are on the correct medications and helps [place] not miss when refills are needed.  We are checking some labs today, I will call you if they are abnormal will send you a MyChart message or a letter if they are normal.  If you do not hear about your labs in the next 2 weeks please let us know.  Take care and seek immediate care sooner if you develop any concerns. Please remember to show up 15 minutes before your scheduled appointment time!  Tiffany Kocher, DO [facility]

## 2022-11-26 NOTE — Progress Notes (Signed)
    SUBJECTIVE:   Chief compliant/HPI: annual examination  Ronav Furney is a 37 y.o. who presents today for an annual exam. No acute concerns.  Reviewed and updated history.  OBJECTIVE:   BP 120/60   Pulse 99   Wt 193 lb 2 oz (87.6 kg)   SpO2 99%   BMI 29.36 kg/m    General: NAD, pleasant Cardio: RRR, no MRG. Respiratory: CTAB, normal wob on RA GI: Abdomen is soft, not tender, not distended. BS present Skin: Warm and dry  ASSESSMENT/PLAN:   Assessment & Plan Encounter for screening for other metabolic disorders See AVS for age appropriate recommendations  PHQ score 0, reviewed and discussed.  Blood pressure reviewed and at goal.   Considered the following items based upon USPSTF recommendations: HIV testing: not indicated Hepatitis C: not indicated Hepatitis B: not indicated Syphilis if at high risk: {not indicated GC/CTnot indicated Lipid panel (nonfasting or fasting) discussed based upon AHA recommendations and ordered.  Consider repeat every 4-6 years.  Reviewed risk factors for latent tuberculosis and not indicated Immunizations up to date  Obtained BMP Follow up in 1 year or sooner if indicated.   Tiffany Kocher, DO [facility]

## 2022-11-27 LAB — LIPID PANEL
Chol/HDL Ratio: 4.5 {ratio} (ref 0.0–5.0)
Cholesterol, Total: 156 mg/dL (ref 100–199)
HDL: 35 mg/dL — ABNORMAL LOW (ref >39)
LDL Chol Calc (NIH): 106 mg/dL — ABNORMAL HIGH (ref 0–99)
Triglycerides: 76 mg/dL (ref 0–149)
VLDL Cholesterol Cal: 15 mg/dL (ref 5–40)

## 2022-11-27 LAB — BASIC METABOLIC PANEL
BUN/Creatinine Ratio: 14 (ref 9–20)
BUN: 16 mg/dL (ref 6–20)
CO2: 24 mmol/L (ref 20–29)
Calcium: 9.4 mg/dL (ref 8.7–10.2)
Chloride: 105 mmol/L (ref 96–106)
Creatinine, Ser: 1.14 mg/dL (ref 0.76–1.27)
Glucose: 109 mg/dL — ABNORMAL HIGH (ref 70–99)
Potassium: 4.1 mmol/L (ref 3.5–5.2)
Sodium: 145 mmol/L — ABNORMAL HIGH (ref 134–144)
eGFR: 86 mL/min/{1.73_m2} (ref >59)

## 2022-12-03 ENCOUNTER — Other Ambulatory Visit: Payer: Self-pay

## 2022-12-03 DIAGNOSIS — F909 Attention-deficit hyperactivity disorder, unspecified type: Secondary | ICD-10-CM

## 2022-12-03 MED ORDER — AMPHETAMINE-DEXTROAMPHET ER 25 MG PO CP24: 25.0000 mg | ORAL_CAPSULE | ORAL | 0 refills | Status: DC

## 2022-12-05 ENCOUNTER — Other Ambulatory Visit: Payer: Self-pay

## 2022-12-05 DIAGNOSIS — F909 Attention-deficit hyperactivity disorder, unspecified type: Secondary | ICD-10-CM

## 2022-12-05 MED ORDER — AMPHETAMINE-DEXTROAMPHET ER 25 MG PO CP24
25.0000 mg | ORAL_CAPSULE | ORAL | 0 refills | Status: DC
Start: 2022-12-05 — End: 2022-12-05

## 2022-12-05 NOTE — Telephone Encounter (Signed)
Patient returns call to nurse line. Previous prescription sent to print as well.   Pended prescription to this encounter.  Veronda Prude, RN

## 2022-12-05 NOTE — Addendum Note (Signed)
Addended by: Veronda Prude on: 12/05/2022 05:05 PM   Modules accepted: Orders

## 2022-12-05 NOTE — Telephone Encounter (Signed)
Patient calls nurse line requesting a refill on Adderall.   Medication was sent in on 10/23 by PCP, however it was set to print.   Will forward to PCP to resend.   [facility] [place]

## 2022-12-07 MED ORDER — AMPHETAMINE-DEXTROAMPHET ER 25 MG PO CP24
25.0000 mg | ORAL_CAPSULE | ORAL | 0 refills | Status: DC
Start: 2022-12-07 — End: 2023-01-05

## 2023-01-05 ENCOUNTER — Other Ambulatory Visit: Payer: Self-pay

## 2023-01-05 DIAGNOSIS — F909 Attention-deficit hyperactivity disorder, unspecified type: Secondary | ICD-10-CM

## 2023-01-05 MED ORDER — AMPHETAMINE-DEXTROAMPHET ER 25 MG PO CP24
25.0000 mg | ORAL_CAPSULE | ORAL | 0 refills | Status: DC
Start: 2023-01-05 — End: 2023-02-02

## 2023-02-02 ENCOUNTER — Other Ambulatory Visit: Payer: Self-pay

## 2023-02-02 DIAGNOSIS — F909 Attention-deficit hyperactivity disorder, unspecified type: Secondary | ICD-10-CM

## 2023-02-02 MED ORDER — AMPHETAMINE-DEXTROAMPHET ER 25 MG PO CP24
25.0000 mg | ORAL_CAPSULE | ORAL | 0 refills | Status: DC
Start: 2023-02-02 — End: 2023-02-03

## 2023-02-02 NOTE — Telephone Encounter (Signed)
Patient returns call to nurse line regarding rx refill.   He is asking if prescription can be sent to [facility] in [place] as he is traveling out of town for the holidays.   Advised that I was unsure if provider would be able to prescribe controlled substance in another state, however, I would forward the request.   If appropriate, patient is requesting that [facility]  [address] [place], [place], [address].   Please advise.   Veronda Prude, RN

## 2023-02-03 ENCOUNTER — Other Ambulatory Visit: Payer: Self-pay | Admitting: Family Medicine

## 2023-02-03 DIAGNOSIS — F909 Attention-deficit hyperactivity disorder, unspecified type: Secondary | ICD-10-CM

## 2023-02-05 ENCOUNTER — Encounter: Payer: Self-pay | Admitting: Student

## 2023-02-05 DIAGNOSIS — F909 Attention-deficit hyperactivity disorder, unspecified type: Secondary | ICD-10-CM

## 2023-02-05 MED ORDER — AMPHETAMINE-DEXTROAMPHET ER 25 MG PO CP24
25.0000 mg | ORAL_CAPSULE | ORAL | 0 refills | Status: DC
Start: 2023-02-05 — End: 2023-02-05

## 2023-02-05 NOTE — Telephone Encounter (Signed)
Called and canceled at [facility] on [address], per patient request.   Please send new prescription to [facility] [address] [place], [place]

## 2023-02-06 MED ORDER — AMPHETAMINE-DEXTROAMPHET ER 25 MG PO CP24
25.0000 mg | ORAL_CAPSULE | ORAL | 0 refills | Status: DC
Start: 2023-02-06 — End: 2023-02-06

## 2023-02-06 NOTE — Addendum Note (Signed)
Addended by: Lincoln Brigham on: 02/06/2023 04:16 PM   Modules accepted: Orders

## 2023-02-06 NOTE — Telephone Encounter (Signed)
Patient returns call to nurse line. There is an issue at the pharmacy with processing DEA number.   Requesting that prescription be sent to [facility] as he will be back in town around the beginning of next week.   I have called and canceled prescription at [facility].   Veronda Prude, RN

## 2023-02-06 NOTE — Addendum Note (Signed)
Addended by: Veronda Prude on: 02/06/2023 03:30 PM   Modules accepted: Orders

## 2023-02-08 MED ORDER — AMPHETAMINE-DEXTROAMPHET ER 25 MG PO CP24
25.0000 mg | ORAL_CAPSULE | ORAL | 0 refills | Status: DC
Start: 2023-02-08 — End: 2023-03-10

## 2023-03-10 ENCOUNTER — Other Ambulatory Visit: Payer: Self-pay

## 2023-03-10 DIAGNOSIS — F909 Attention-deficit hyperactivity disorder, unspecified type: Secondary | ICD-10-CM

## 2023-03-10 MED ORDER — AMPHETAMINE-DEXTROAMPHET ER 25 MG PO CP24
25.0000 mg | ORAL_CAPSULE | ORAL | 0 refills | Status: DC
Start: 2023-03-10 — End: 2023-04-13

## 2023-04-13 ENCOUNTER — Other Ambulatory Visit: Payer: Self-pay

## 2023-04-13 DIAGNOSIS — F909 Attention-deficit hyperactivity disorder, unspecified type: Secondary | ICD-10-CM

## 2023-04-13 MED ORDER — AMPHETAMINE-DEXTROAMPHET ER 25 MG PO CP24
25.0000 mg | ORAL_CAPSULE | ORAL | 0 refills | Status: DC
Start: 2023-04-13 — End: 2023-05-12

## 2023-05-12 ENCOUNTER — Other Ambulatory Visit: Payer: Self-pay

## 2023-05-12 DIAGNOSIS — F909 Attention-deficit hyperactivity disorder, unspecified type: Secondary | ICD-10-CM

## 2023-05-13 MED ORDER — AMPHETAMINE-DEXTROAMPHET ER 25 MG PO CP24: 25.0000 mg | ORAL_CAPSULE | ORAL | 0 refills | Status: DC

## 2023-06-08 ENCOUNTER — Other Ambulatory Visit: Payer: Self-pay

## 2023-06-08 DIAGNOSIS — F909 Attention-deficit hyperactivity disorder, unspecified type: Secondary | ICD-10-CM

## 2023-06-08 MED ORDER — AMPHETAMINE-DEXTROAMPHET ER 25 MG PO CP24: 25.0000 mg | ORAL_CAPSULE | ORAL | 0 refills | Status: DC

## 2023-06-08 NOTE — Telephone Encounter (Signed)
 Patient calls nurse line requesting refill on Adderall.   He reports that he is driving to [place]  on Friday, 06/12/23 and will not be coming back until 5/6.  Patient is asking to get refill a few days early, so he can pick up medication before going out of town.   Will forward request to PCP.   Elsie Halo, RN

## 2023-07-10 ENCOUNTER — Other Ambulatory Visit: Payer: Self-pay

## 2023-07-10 DIAGNOSIS — F909 Attention-deficit hyperactivity disorder, unspecified type: Secondary | ICD-10-CM

## 2023-07-10 MED ORDER — AMPHETAMINE-DEXTROAMPHET ER 25 MG PO CP24: 25.0000 mg | ORAL_CAPSULE | ORAL | 0 refills | Status: DC

## 2023-07-13 MED ORDER — AMPHETAMINE-DEXTROAMPHET ER 25 MG PO CP24: 25.0000 mg | ORAL_CAPSULE | ORAL | 0 refills | Status: DC

## 2023-07-13 NOTE — Telephone Encounter (Signed)
 Patient calls nurse line regarding Adderall prescription.   Per chart review, rx was approved, however was ordered as "print."   Forwarding to Dr. Telford Feather to resend electronically.   Elsie Halo, RN

## 2023-07-13 NOTE — Addendum Note (Signed)
 Addended by: Ceairra Mccarver C on: 07/13/2023 08:49 AM   Modules accepted: Orders

## 2023-08-04 ENCOUNTER — Encounter: Payer: Self-pay | Admitting: Student

## 2023-08-04 ENCOUNTER — Ambulatory Visit: Payer: Self-pay | Admitting: Student

## 2023-08-04 VITALS — BP 121/74 | HR 93 | Ht 68.0 in | Wt 184.1 lb

## 2023-08-04 DIAGNOSIS — Z131 Encounter for screening for diabetes mellitus: Secondary | ICD-10-CM

## 2023-08-04 DIAGNOSIS — N528 Other male erectile dysfunction: Secondary | ICD-10-CM | POA: Diagnosis not present

## 2023-08-04 DIAGNOSIS — Z0000 Encounter for general adult medical examination without abnormal findings: Secondary | ICD-10-CM | POA: Diagnosis not present

## 2023-08-04 DIAGNOSIS — L910 Hypertrophic scar: Secondary | ICD-10-CM

## 2023-08-04 DIAGNOSIS — Z13228 Encounter for screening for other metabolic disorders: Secondary | ICD-10-CM

## 2023-08-04 LAB — POCT GLYCOSYLATED HEMOGLOBIN (HGB A1C): Hemoglobin A1C: 5.1 % (ref 4.0–5.6)

## 2023-08-04 MED ORDER — TRIAMCINOLONE ACETONIDE 40 MG/ML IJ SUSP
40.0000 mg | Freq: Once | INTRAMUSCULAR | Status: AC
  Administered 2023-08-04: 40 mg via INTRADERMAL

## 2023-08-04 MED ORDER — TRIAMCINOLONE ACETONIDE 0.5 % EX OINT: 1.0000 | TOPICAL_OINTMENT | Freq: Two times a day (BID) | CUTANEOUS | 0 refills | Status: AC

## 2023-08-04 NOTE — Progress Notes (Signed)
    SUBJECTIVE:   Chief compliant/HPI: annual examination  Aisea Bouldin is a 38 y.o. who presents today for an annual exam.   Reviewed and updated history.   Keloid Presence of hypertrophic scar of skin for multiple years, previously received intralesional injections a couple of years ago that improved the size.  Recently keloid has increased in size, and become painful.  He is interested in injections and topical steroids.  No bleeding, no signs of infection.  Other skin lesion Hypopigmented nevus on back, patient requesting referral.  We will send referral to dermatology clinic.  Erectile dysfunction Erectile dysfunction for 2 months. Trouble starting and maintaining erection. Morning erections are present. No daily, weekly, monthly alcohol use.  Alcohol use is infrequent. No difficulty with ejaculation. Normal libido. Going through stressful divorce. No pain in genitals. No other systemic symptoms. Although erectile dysfunction can be a side effect of Adderall, he has been stable with medication for several years-has a very new problem with a new stressor making this less likely side effect.   OBJECTIVE:   BP 121/74   Pulse 93   Ht 5' 8 (1.727 m)   Wt 184 lb 2 oz (83.5 kg)   SpO2 97%   BMI 28.00 kg/m    General: NAD, pleasant Cardio: RRR, no MRG. Cap Refill <2s. Respiratory: CTAB, normal wob on RA Skin: 2 cm long  hypertrophic scar consistent with keloid  PROCEDURE: Intralesional INJECTION: Keloid Patient was given informed consent, signed copy in the chart. Appropriate time out was taken. Area prepped and draped in usual sterile fashion.  A 23-gauge 1/2 inch needle was used. 0.5 cc of 40 mg per mL triamcinolone  was injected intralesionally into keloid. The patient tolerated the procedure well. There were no complications. Post procedure instructions were given.  ASSESSMENT/PLAN:   Assessment & Plan Keloid Painful, increase in size.  Previously responded well  to intralesional steroids. - Intralesional injection today see procedure note above - 0.5% Triamcinolone  Ointment Twice Daily for 2 Weeks Annual visit for general adult medical examination without abnormal findings Blood pressure reviewed and at goal.  Defer HIV/hep C screening as this is being done prior.  Over sexual behavior, requesting testing.  Recent lipid panel, will not repeat. Other male erectile dysfunction Differential: Stress-induced.  Low concern for medication side effect, low testosterone (normal libido, no other systemic symptoms).  Blood pressure WNL. - Recommend individual or couples counseling - Follow-up in 6-8 weeks - Given young age, if not improving would recommend GU exam at next visit and consideration of testosterone levels  Gladis Church, DO [facility]

## 2023-08-04 NOTE — Patient Instructions (Signed)
 It was great to see you! Thank you for allowing me to participate in your care!   I recommend that you always bring your medications to each appointment as this makes it easy to ensure we are on the correct medications and helps us  not miss when refills are needed.  Our plans for today:  - Schedule follow-up in 2-4 weeks - I will send a referral to our skin clinic - Leave band aid in place for 24 hours, you can shower but do not submerge in water for 24 hours. - We are checking some labs today, I will call you if they are abnormal will send you a MyChart message or a letter if they are normal.  If you do not hear about your labs in the next 2 weeks please let us  know.  Take care and seek immediate care sooner if you develop any concerns. Please remember to show up 15 minutes before your scheduled appointment time!  Gladis Church, DO [facility]

## 2023-08-04 NOTE — Addendum Note (Signed)
 Addended by: HOWELL LUNGER on: 08/04/2023 05:19 PM   Modules accepted: Level of Service

## 2023-08-05 LAB — COMPREHENSIVE METABOLIC PANEL WITH GFR
ALT: 16 IU/L (ref 0–44)
AST: 12 IU/L (ref 0–40)
Albumin: 4.9 g/dL (ref 4.1–5.1)
Alkaline Phosphatase: 65 IU/L (ref 44–121)
BUN/Creatinine Ratio: 17 (ref 9–20)
BUN: 17 mg/dL (ref 6–20)
Bilirubin Total: 0.5 mg/dL (ref 0.0–1.2)
CO2: 25 mmol/L (ref 20–29)
Calcium: 9.9 mg/dL (ref 8.7–10.2)
Chloride: 101 mmol/L (ref 96–106)
Creatinine, Ser: 0.98 mg/dL (ref 0.76–1.27)
Globulin, Total: 2.5 g/dL (ref 1.5–4.5)
Glucose: 83 mg/dL (ref 70–99)
Potassium: 4.7 mmol/L (ref 3.5–5.2)
Sodium: 141 mmol/L (ref 134–144)
Total Protein: 7.4 g/dL (ref 6.0–8.5)
eGFR: 102 mL/min/{1.73_m2} (ref >59)

## 2023-08-06 ENCOUNTER — Other Ambulatory Visit: Payer: Self-pay | Admitting: Student

## 2023-08-06 DIAGNOSIS — F909 Attention-deficit hyperactivity disorder, unspecified type: Secondary | ICD-10-CM

## 2023-08-06 MED ORDER — AMPHETAMINE-DEXTROAMPHET ER 25 MG PO CP24
25.0000 mg | ORAL_CAPSULE | ORAL | 0 refills | Status: DC
Start: 2023-08-06 — End: 2023-09-01

## 2023-08-06 NOTE — Progress Notes (Signed)
 Adderall refill, change of pharmacy.

## 2023-08-28 ENCOUNTER — Ambulatory Visit (INDEPENDENT_AMBULATORY_CARE_PROVIDER_SITE_OTHER): Admitting: Student

## 2023-08-28 ENCOUNTER — Encounter: Payer: Self-pay | Admitting: Student

## 2023-08-28 VITALS — BP 131/68 | HR 65 | Ht 68.0 in | Wt 184.0 lb

## 2023-08-28 DIAGNOSIS — L910 Hypertrophic scar: Secondary | ICD-10-CM

## 2023-08-28 DIAGNOSIS — L989 Disorder of the skin and subcutaneous tissue, unspecified: Secondary | ICD-10-CM

## 2023-08-28 NOTE — Progress Notes (Signed)
    SUBJECTIVE:   CHIEF COMPLAINT / HPI:   Keloid Status post intralesional injection.  Currently using topical steroid cream.  Reports improvement in pain and some improvement in size.  Hypopigmented lesion on back Lesion on back is often getting caught on clothing, also can be painful at times.  OBJECTIVE:   BP 131/68   Pulse 65   Ht 5' 8 (1.727 m)   Wt 184 lb (83.5 kg)   SpO2 99%   BMI 27.98 kg/m    General: NAD, pleasant,  Skin: Approximately 1.5 cm keloid, erythematous on chest.  Approximately 0.6 cm hypopigmented, raised lesion.       Procedure note Pre-op Diagnosis: Hypopigmented nevus v. Skin tag Post-op Diagnosis: Same Procedure: Shave Skin Biopsy Location: Lower left back Performing Physician: Gladis Church, DO Supervising Physician (if applicable): Krystal McDiarmid, MD  Informed consent was obtained prior to the procedure. Time out performed. The area surrounding the skin lesion was prepared and cleaned with alcohol prep. The area was sufficiently anesthetized with 1% Lidocaine with epinephrine . A sterile disposable DermaBlade was used to obtain tissue sample and placed in labeled biopsy cup. Pressure was used to obtain hemostasis. The patient tolerated the procedure well without complications.  Standard post-procedure care was explained and return precautions and wound care handout given.  ASSESSMENT/PLAN:   Assessment & Plan Skin lesion of back Painful lesion. Procedure as above. - Follow-up skin biopsy results Keloid Softening and slightly improved with intralesional injection plus topical steroid.-Continue topical steroid - Follow-up in 2 weeks, consider reinjection at that time   Gladis Church, DO [facility]

## 2023-08-28 NOTE — Patient Instructions (Addendum)
 It was great to see you! Thank you for allowing me to participate in your care!   I recommend that you always bring your medications to each appointment as this makes it easy to ensure we are on the correct medications and helps us  not miss when refills are needed.  Our plans for today:  - Continue taking your triamcinolone  for keloid. - Please do not submerge the site of biopsy with water for least 24 hours.  If you develop pain.  Take Tylenol/Advil .  Last for several days, if you develop fever or uncontrolled bleeding-please call us  immediately and go to the emergency room. - Follow-up in 2 weeks for keloid  Take care and seek immediate care sooner if you develop any concerns. Please remember to show up 15 minutes before your scheduled appointment time!  Gladis Church, DO [facility]

## 2023-08-31 LAB — DERMATOLOGY PATHOLOGY

## 2023-09-01 ENCOUNTER — Ambulatory Visit: Payer: Self-pay | Admitting: Student

## 2023-09-01 ENCOUNTER — Other Ambulatory Visit: Payer: Self-pay

## 2023-09-01 DIAGNOSIS — F909 Attention-deficit hyperactivity disorder, unspecified type: Secondary | ICD-10-CM

## 2023-09-01 NOTE — Telephone Encounter (Signed)
 Patient LVM on nurse line requesting a refill on Adderall.  He reports he is going out of town this weekend and reports he would like to pick this up tomorrow.   Will forward to PCP.

## 2023-09-02 MED ORDER — AMPHETAMINE-DEXTROAMPHET ER 25 MG PO CP24
25.0000 mg | ORAL_CAPSULE | ORAL | 0 refills | Status: DC
Start: 2023-09-02 — End: 2023-09-29

## 2023-09-02 NOTE — Telephone Encounter (Signed)
 Patient returns call to nurse line regarding Adderall refill.   I called [facility]. They have to order medication and expect shipment either tomorrow or Friday. Attempted to call patient back to provide him with this update.   He did not answer, LVM asking that patient return call to office regarding rx refill. I also sent patient mychart message.   Chiquita JAYSON English, RN

## 2023-09-04 NOTE — Telephone Encounter (Signed)
 Called patient. Patient reports that he was able to pick up prescription from [facility] on [address].   No further action needed at this time.   Chiquita JAYSON English, RN

## 2023-09-29 ENCOUNTER — Ambulatory Visit (INDEPENDENT_AMBULATORY_CARE_PROVIDER_SITE_OTHER): Admitting: Student

## 2023-09-29 ENCOUNTER — Encounter: Payer: Self-pay | Admitting: Student

## 2023-09-29 VITALS — BP 110/63 | HR 86 | Ht 68.0 in | Wt 190.4 lb

## 2023-09-29 DIAGNOSIS — L910 Hypertrophic scar: Secondary | ICD-10-CM | POA: Diagnosis not present

## 2023-09-29 DIAGNOSIS — F909 Attention-deficit hyperactivity disorder, unspecified type: Secondary | ICD-10-CM | POA: Diagnosis not present

## 2023-09-29 MED ORDER — TRIAMCINOLONE ACETONIDE 40 MG/ML IJ SUSP
40.0000 mg | Freq: Once | INTRAMUSCULAR | Status: AC
  Administered 2023-09-29: 40 mg via INTRAMUSCULAR

## 2023-09-29 MED ORDER — AMPHETAMINE-DEXTROAMPHET ER 25 MG PO CP24: 25.0000 mg | ORAL_CAPSULE | ORAL | 0 refills | Status: DC

## 2023-09-29 NOTE — Progress Notes (Signed)
    SUBJECTIVE:   CHIEF COMPLAINT / HPI:   Keloid Patient presents for follow-up of his keloid scar.  He has had 1 injection several weeks ago, and using topical steroid.  Keloid has softened, but size is not diminished.  Using shared decision making, he would like to elect with second intralesional injection.  If this does not work, he is interested in seeing dermatology.  ADHD Well-controlled ADHD, adherent with Adderall.  He has no concerns.  OBJECTIVE:   BP 110/63   Pulse 86   Ht 5' 8 (1.727 m)   Wt 190 lb 6.4 oz (86.4 kg)   SpO2 99%   BMI 28.95 kg/m    General: NAD, well-appearing, well-nourished Respiratory: No respiratory distress, breathing comfortably, able to speak in full sentences Skin: Approximate 1.5 cm x 0.5 cm keloid scar on chest.  See below. Psych: Appropriate affect and mood     PROCEDURE NOTE:   Intralesional Steroid Injection Keloid Consent was obtained.  Timeout performed.  Alcohol swabs used for antiseptic.  Anesthetics were not used.  Using clean technique, injected 0.5 mL triamcinolone  40 mg.  There was no bleeding.  Band-Aid placed over injection site.  Patient tolerated procedure well.  Instructions were provided.  ASSESSMENT/PLAN:   Assessment & Plan Keloid Softened, but no size reduction. - Continue topical triamcinolone  ointment - Intralesional steroid injection today (see procedure note above) - If no improvements are made, we will place referral to dermatology Attention deficit hyperactivity disorder (ADHD), unspecified ADHD type Well-controlled -Refill Adderall today     Gladis Church, DO [facility] Center

## 2023-09-29 NOTE — Assessment & Plan Note (Signed)
 Well-controlled -Refill Adderall today

## 2023-11-02 ENCOUNTER — Other Ambulatory Visit: Payer: Self-pay

## 2023-11-02 DIAGNOSIS — F909 Attention-deficit hyperactivity disorder, unspecified type: Secondary | ICD-10-CM

## 2023-11-02 MED ORDER — AMPHETAMINE-DEXTROAMPHET ER 25 MG PO CP24: 25.0000 mg | ORAL_CAPSULE | ORAL | 0 refills | Status: DC

## 2023-11-18 ENCOUNTER — Encounter: Payer: Self-pay | Admitting: Student

## 2023-11-18 ENCOUNTER — Ambulatory Visit (INDEPENDENT_AMBULATORY_CARE_PROVIDER_SITE_OTHER): Admitting: Student

## 2023-11-18 VITALS — BP 117/74 | HR 76 | Ht 68.0 in | Wt 189.8 lb

## 2023-11-18 DIAGNOSIS — L989 Disorder of the skin and subcutaneous tissue, unspecified: Secondary | ICD-10-CM | POA: Diagnosis not present

## 2023-11-18 DIAGNOSIS — Z23 Encounter for immunization: Secondary | ICD-10-CM | POA: Diagnosis not present

## 2023-11-18 MED ORDER — METHYLPREDNISOLONE ACETATE 40 MG/ML IJ SUSP
40.0000 mg | Freq: Once | INTRAMUSCULAR | Status: AC
  Administered 2023-11-18: 40 mg via INTRAMUSCULAR

## 2023-11-18 NOTE — Assessment & Plan Note (Signed)
 Persistent keloid with itching, unresponsive to current treatment. Low suspicion for other conditions. - Send referral to dermatology clinic for further evaluation and potential laser therapy. - Administer steroid injection to keloid. - Recommend daily Zyrtec  for itching.

## 2023-11-18 NOTE — Patient Instructions (Signed)
 It was great to see you! Thank you for allowing me to participate in your care!   I recommend that you always bring your medications to each appointment as this makes it easy to ensure we are on the correct medications and helps us  not miss when refills are needed.  Our plans for today:  - I have sent a referral to [facility]  Take care and seek immediate care sooner if you develop any concerns. Please remember to show up 15 minutes before your scheduled appointment time!  Gladis Church, DO [facility]

## 2023-11-18 NOTE — Progress Notes (Signed)
    SUBJECTIVE:   CHIEF COMPLAINT / HPI:   Discussed the use of AI scribe software for clinical note transcription with the patient, who gave verbal consent to proceed.  History of Present Illness Nathaniel Soto is a 38 year old male who presents with persistent itching and lack of improvement in a keloid.  Keloid formation - Keloid located in the same area as previously documented - Previous steroid injections resulted in temporary reduction in keloid size + sxs, but the effect was not sustained - Current use of topical cream does not reduce keloid size, nor alleviate itching - Persistent and significant itching associated with the keloid - Itching was particularly severe last night   OBJECTIVE:   BP 117/74   Pulse 76   Ht 5' 8 (1.727 m)   Wt 189 lb 12.8 oz (86.1 kg)   SpO2 99%   BMI 28.86 kg/m     General: NAD, well-appearing, well-nourished Respiratory: No respiratory distress, breathing comfortably, able to speak in full sentences Skin: warm and dry. Keloid scar (see image below) Psych: Appropriate affect and mood    ASSESSMENT/PLAN:   Assessment & Plan Skin lesion Persistent keloid with itching, unresponsive to current treatment. Low suspicion for other conditions. - Send referral to dermatology clinic for further evaluation and potential laser therapy. - Administer steroid injection to keloid. - Recommend daily Zyrtec  for itching. Encounter for immunization Covid and flu shot today   Gladis Church, DO [facility] Center

## 2023-11-18 NOTE — Addendum Note (Signed)
 Addended by: Elgin Carn on: 11/18/2023 11:31 AM   Modules accepted: Orders

## 2023-12-01 ENCOUNTER — Other Ambulatory Visit: Payer: Self-pay

## 2023-12-01 DIAGNOSIS — F909 Attention-deficit hyperactivity disorder, unspecified type: Secondary | ICD-10-CM

## 2023-12-01 MED ORDER — AMPHETAMINE-DEXTROAMPHET ER 25 MG PO CP24: 25.0000 mg | ORAL_CAPSULE | ORAL | 0 refills | Status: DC

## 2023-12-31 ENCOUNTER — Other Ambulatory Visit: Payer: Self-pay

## 2023-12-31 DIAGNOSIS — F909 Attention-deficit hyperactivity disorder, unspecified type: Secondary | ICD-10-CM

## 2023-12-31 MED ORDER — AMPHETAMINE-DEXTROAMPHET ER 25 MG PO CP24: 25.0000 mg | ORAL_CAPSULE | ORAL | 0 refills | Status: DC

## 2024-02-01 ENCOUNTER — Other Ambulatory Visit: Payer: Self-pay

## 2024-02-01 DIAGNOSIS — F909 Attention-deficit hyperactivity disorder, unspecified type: Secondary | ICD-10-CM

## 2024-02-01 MED ORDER — AMPHETAMINE-DEXTROAMPHET ER 25 MG PO CP24: 25.0000 mg | ORAL_CAPSULE | ORAL | 0 refills | Status: DC

## 2024-03-01 ENCOUNTER — Telehealth: Payer: Self-pay

## 2024-03-01 ENCOUNTER — Other Ambulatory Visit: Payer: Self-pay

## 2024-03-01 DIAGNOSIS — F909 Attention-deficit hyperactivity disorder, unspecified type: Secondary | ICD-10-CM

## 2024-03-01 MED ORDER — AMPHETAMINE-DEXTROAMPHET ER 25 MG PO CP24: 25.0000 mg | ORAL_CAPSULE | ORAL | 0 refills | Status: AC

## 2024-03-01 NOTE — Telephone Encounter (Signed)
 Patient calls nurse line in regards to Dermatology referral.   He reports he was referred several months ago, however has not heard from anyone.   He reports he requests [facility].   Advised it appears referral was processed and advised to call for initial apt.   Patient reports he will call me back if there is an issue scheduling.
